# Patient Record
Sex: Male | Born: 1955 | Race: White | Hispanic: No | Marital: Married
Health system: Southern US, Community
[De-identification: ages and names within clinical notes are randomized; demographics above are authoritative.]

## PROBLEM LIST (undated history)

## (undated) DIAGNOSIS — K219 Gastro-esophageal reflux disease without esophagitis: Secondary | ICD-10-CM

## (undated) DIAGNOSIS — J349 Unspecified disorder of nose and nasal sinuses: Secondary | ICD-10-CM

## (undated) HISTORY — DX: Unspecified disorder of nose and nasal sinuses: J34.9

## (undated) HISTORY — DX: Gastro-esophageal reflux disease without esophagitis: K21.9

---

## 1997-12-09 HISTORY — PX: BACK SURGERY: SHX140

## 1998-11-14 ENCOUNTER — Encounter: Payer: Self-pay | Admitting: Neurosurgery

## 1998-11-16 ENCOUNTER — Encounter: Payer: Self-pay | Admitting: Neurosurgery

## 1998-11-16 ENCOUNTER — Inpatient Hospital Stay (HOSPITAL_COMMUNITY): Admission: RE | Admit: 1998-11-16 | Discharge: 1998-11-17 | Payer: Self-pay | Admitting: Neurosurgery

## 1999-12-10 HISTORY — PX: ELBOW SURGERY: SHX618

## 2000-04-28 ENCOUNTER — Encounter: Admission: RE | Admit: 2000-04-28 | Discharge: 2000-04-28 | Payer: Self-pay | Admitting: Family Medicine

## 2000-04-28 ENCOUNTER — Encounter: Payer: Self-pay | Admitting: Family Medicine

## 2001-04-02 ENCOUNTER — Encounter (INDEPENDENT_AMBULATORY_CARE_PROVIDER_SITE_OTHER): Payer: Self-pay | Admitting: Specialist

## 2001-04-02 ENCOUNTER — Ambulatory Visit (HOSPITAL_COMMUNITY): Admission: RE | Admit: 2001-04-02 | Discharge: 2001-04-02 | Payer: Self-pay | Admitting: *Deleted

## 2001-08-03 ENCOUNTER — Emergency Department (HOSPITAL_COMMUNITY): Admission: EM | Admit: 2001-08-03 | Discharge: 2001-08-03 | Payer: Self-pay | Admitting: Emergency Medicine

## 2001-08-05 ENCOUNTER — Ambulatory Visit (HOSPITAL_BASED_OUTPATIENT_CLINIC_OR_DEPARTMENT_OTHER): Admission: RE | Admit: 2001-08-05 | Discharge: 2001-08-05 | Payer: Self-pay | Admitting: Surgery

## 2001-08-05 ENCOUNTER — Encounter (INDEPENDENT_AMBULATORY_CARE_PROVIDER_SITE_OTHER): Payer: Self-pay | Admitting: *Deleted

## 2001-08-11 ENCOUNTER — Encounter (HOSPITAL_COMMUNITY): Admission: RE | Admit: 2001-08-11 | Discharge: 2001-11-09 | Payer: Self-pay | Admitting: Emergency Medicine

## 2001-08-15 ENCOUNTER — Emergency Department (HOSPITAL_COMMUNITY): Admission: EM | Admit: 2001-08-15 | Discharge: 2001-08-15 | Payer: Self-pay | Admitting: Emergency Medicine

## 2003-01-27 ENCOUNTER — Encounter: Payer: Self-pay | Admitting: Family Medicine

## 2003-01-27 ENCOUNTER — Encounter: Admission: RE | Admit: 2003-01-27 | Discharge: 2003-01-27 | Payer: Self-pay | Admitting: Family Medicine

## 2004-12-14 ENCOUNTER — Ambulatory Visit: Payer: Self-pay | Admitting: Family Medicine

## 2005-02-11 ENCOUNTER — Ambulatory Visit: Payer: Self-pay | Admitting: Family Medicine

## 2005-03-18 ENCOUNTER — Ambulatory Visit: Payer: Self-pay | Admitting: Family Medicine

## 2005-06-24 ENCOUNTER — Ambulatory Visit: Payer: Self-pay | Admitting: Family Medicine

## 2005-08-29 ENCOUNTER — Ambulatory Visit: Payer: Self-pay | Admitting: Family Medicine

## 2006-01-09 ENCOUNTER — Ambulatory Visit: Payer: Self-pay | Admitting: Family Medicine

## 2006-02-06 ENCOUNTER — Ambulatory Visit: Payer: Self-pay | Admitting: Family Medicine

## 2006-09-09 ENCOUNTER — Ambulatory Visit: Payer: Self-pay | Admitting: Family Medicine

## 2007-01-22 ENCOUNTER — Ambulatory Visit: Payer: Self-pay | Admitting: Family Medicine

## 2007-02-09 ENCOUNTER — Ambulatory Visit: Payer: Self-pay | Admitting: Internal Medicine

## 2007-02-11 ENCOUNTER — Ambulatory Visit: Payer: Self-pay | Admitting: Internal Medicine

## 2007-02-25 ENCOUNTER — Encounter (INDEPENDENT_AMBULATORY_CARE_PROVIDER_SITE_OTHER): Payer: Self-pay | Admitting: *Deleted

## 2007-02-25 ENCOUNTER — Ambulatory Visit: Payer: Self-pay | Admitting: Internal Medicine

## 2007-03-02 ENCOUNTER — Ambulatory Visit (HOSPITAL_COMMUNITY): Admission: RE | Admit: 2007-03-02 | Discharge: 2007-03-02 | Payer: Self-pay | Admitting: Internal Medicine

## 2007-06-02 ENCOUNTER — Ambulatory Visit: Payer: Self-pay | Admitting: Family Medicine

## 2011-04-26 NOTE — Assessment & Plan Note (Signed)
[facility]                         GASTROENTEROLOGY OFFICE NOTE   NAME:Adam Bautista, Adam Bautista                     MRN:          [id number]  DATE:02/09/2007                            DOB:          10-26-1956    CHIEF COMPLAINT:  Left upper quadrant pain, history of colon polyps.   ASSESSMENT:  1. History of adenomatous colon polyp April 2002 (Dr. Luther Parody).  2. Family history of colon cancer in a maternal aunt and a paternal      grandmother. His father has had colon polyps.  3. Intermittent left upper quadrant pain for one year, unclear      etiology.   PLAN:  1. Schedule colonoscopy for polyp surveillance. His left upper      quadrant pain could be a splenic flexure syndrome or a problem      there.  2. If that is unrevealing, an upper GI endoscopy could be necessary      versus a barium study or both. Previous upper GI in May of 2001,      demonstrated a small sliding type hiatal hernia and a mild smooth      narrowing below that. The barium tablet was delayed there, but did      pass. He has probably had an upper endoscopy in the past, but      cannot remember when and I do not have those records. He may have      some dysphagia, it is not entirely clear to be honest. He did have      a poor primary stripping wave on his upper GI as well so motility      disturbance is possible.   HISTORY:  A 55 year old white man with problems as described above. He  has had these intermittent crampy left upper quadrant pains that come  and go. It feels like he needs to belch and then will have to almost  induce belching by placing his finger in his mouth when it is severe. He  does not really have flatus problems. On his colonoscopy in 2002, he  apparently had some diarrhea issues and random biopsies were negative  for any colitis. He tells me he has lactose intolerance and irritable  bowel. Heartburn is well-controlled on Prilosec. Sometimes he has to  swallow more than once to move food down, but it does not sound like  there is any true impact dysphagia. Occasionally he gets a lump in his  throat or globus sensation. These are chronic problems. He is not  describing weight loss.   MEDICATIONS:  1. Adderall 15/30 mg daily.  2. Prilosec 20 mg daily.  3. Ambien 10 mg at bedtime.   DRUG ALLERGIES:  SULFA.   PAST MEDICAL HISTORY:  1. Attention deficit.  2. Back surgery.  3. Elbow surgery.  4. Allergies.  5. Gastroesophageal reflux disease.  6. Irritable bowel syndrome.  7. Colon polyps.   FAMILY HISTORY:  Also, positive for heart disease in his parents and  breast cancer in a mother and a grandmother. Diabetes in a grandfather.   SOCIAL HISTORY:  He is married.  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 No alcohol, tobacco or drugs.   REVIEW OF SYSTEMS:  He thinks he may have some palpitations at times. He  wears eyeglasses. He has some insomnia issues. Allergy problems. All  other systems are negative or as mentioned above.   PHYSICAL EXAMINATION:  Reveals a well-developed, well-nourished muscular  white man. Height is 6 feet. Weight is 194 pounds. Blood pressure  128/90, pulse 84.  EYES: Anicteric.  ENT: Normal mouth, lids and pharynx.  NECK: Is supple without mass, thyromegaly.  CHEST: Is clear.  HEART: S1, S2. No murmurs, gallops.  ABDOMEN: Soft. Is nontender without organomegaly or mass. Chest wall and  the left upper side is negative for pain or tenderness.  RECTAL: Examination is deferred.  LYMPHATIC: No neck or supraclavicular nodes palpated.  EXTREMITIES: Show no peripheral edema.  SKIN: Warm and dry. No rash.  NEURO: He alert and oriented x3.   I appreciate the opportunity to care for this patient. I have reviewed  the upper GI series from 2001 and the colonoscopy report and biopsy  reports from 2002.     Iva Boop, MD,FACG  Electronically Signed    CEG/MedQ  DD: 02/09/2007  DT:  02/09/2007  Job #: [id number]   cc:   Delaney Meigs, M.D.

## 2011-12-12 ENCOUNTER — Other Ambulatory Visit: Payer: Self-pay | Admitting: Gastroenterology

## 2011-12-31 ENCOUNTER — Other Ambulatory Visit: Payer: Self-pay | Admitting: Dermatology

## 2012-02-04 ENCOUNTER — Encounter: Payer: Self-pay | Admitting: Internal Medicine

## 2012-02-17 ENCOUNTER — Other Ambulatory Visit: Payer: Self-pay | Admitting: Dermatology

## 2012-04-20 ENCOUNTER — Other Ambulatory Visit: Payer: Self-pay | Admitting: Dermatology

## 2012-10-02 ENCOUNTER — Encounter: Payer: Self-pay | Admitting: Internal Medicine

## 2013-11-12 ENCOUNTER — Other Ambulatory Visit: Payer: Self-pay | Admitting: Rheumatology

## 2013-11-12 ENCOUNTER — Ambulatory Visit
Admission: RE | Admit: 2013-11-12 | Discharge: 2013-11-12 | Disposition: A | Discharge status: Home / Self Care | Payer: BC Managed Care – PPO | Source: Ambulatory Visit | Attending: Rheumatology | Admitting: Rheumatology

## 2013-11-12 DIAGNOSIS — R05 Cough: Secondary | ICD-10-CM

## 2013-11-16 ENCOUNTER — Other Ambulatory Visit (HOSPITAL_COMMUNITY): Payer: Self-pay | Admitting: Rheumatology

## 2013-11-16 DIAGNOSIS — R9389 Abnormal findings on diagnostic imaging of other specified body structures: Secondary | ICD-10-CM

## 2013-11-17 ENCOUNTER — Encounter (HOSPITAL_COMMUNITY): Payer: Self-pay

## 2013-11-17 ENCOUNTER — Ambulatory Visit (HOSPITAL_COMMUNITY)
Admission: RE | Admit: 2013-11-17 | Discharge: 2013-11-17 | Disposition: A | Discharge status: Home / Self Care | Payer: BC Managed Care – PPO | Source: Ambulatory Visit | Attending: Rheumatology | Admitting: Rheumatology

## 2013-11-17 DIAGNOSIS — R9389 Abnormal findings on diagnostic imaging of other specified body structures: Secondary | ICD-10-CM

## 2013-11-17 DIAGNOSIS — R918 Other nonspecific abnormal finding of lung field: Secondary | ICD-10-CM | POA: Insufficient documentation

## 2013-11-18 ENCOUNTER — Ambulatory Visit (HOSPITAL_COMMUNITY): Payer: BC Managed Care – PPO

## 2013-12-01 ENCOUNTER — Encounter: Payer: Self-pay | Admitting: Internal Medicine

## 2013-12-01 ENCOUNTER — Encounter (INDEPENDENT_AMBULATORY_CARE_PROVIDER_SITE_OTHER): Payer: Self-pay

## 2013-12-01 ENCOUNTER — Ambulatory Visit (INDEPENDENT_AMBULATORY_CARE_PROVIDER_SITE_OTHER): Payer: BC Managed Care – PPO | Admitting: Internal Medicine

## 2013-12-01 VITALS — BP 140/80 | HR 76 | Ht 72.0 in | Wt <= 1120 oz

## 2013-12-01 DIAGNOSIS — R911 Solitary pulmonary nodule: Secondary | ICD-10-CM

## 2013-12-01 NOTE — Progress Notes (Signed)
Subjective:    Patient ID: Adam Bautista, male    DOB: [birth year], 57 y.o.   MRN: [id number] PCP Josue Hector, MD Referred by: Dr Pollyann Savoy HPI  IOV 12/01/2013  Chief Complaint  Patient presents with  . Advice Only    Referred by Dr Abelina Bachelor for abn ct.      Passive smoker. Runner. SEvera years has had feet arthralgia. 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 CXR apparently showed right sided findings. So, resulted in CT that showed scattered pulm nodules lartgest is LLL 6mm. Denies resp symptoms   REiew of outsider ecords suggest positive anti cardiolipin Ab and ACE but negative quantiferon gold   CLINICAL DATA: Abnormal chest x-ray. Evaluate right lung opacity.  EXAM:  CT 11/17/13 CT CHEST WITHOUT CONTRAST  TECHNIQUE:  Multidetector CT imaging of the chest was performed following the  standard protocol without IV contrast.  COMPARISON: Radiographs 11/12/2013.  FINDINGS:  As evaluated in the noncontrast state, the mediastinum appears  normal. There are no enlarged mediastinal, hilar or axillary lymph  nodes. The heart and aorta appear normal. There are faint coronary  artery calcifications. There is a small hiatal hernia.  There is no pleural or pericardial effusion. There is no airspace  disease or volume loss in the right middle lobe or lingula. There is  no other airspace disease or endobronchial lesion. Scattered small  pulmonary nodules are noted, primarily on the left. The largest  nodules are in the left lower lobe, measuring 6 mm on image 31 and 5  mm on image 40.  The visualized upper abdomen is unremarkable. There is no adrenal  mass or worrisome osseous finding.  IMPRESSION:  1. No acute findings or definite correlate to the questioned  findings on prior radiographs. The right middle lobe and lingula  appear normal.  2. There are small pulmonary nodules bilaterally, largest on the  left.  If the patient is at high risk for bronchogenic carcinoma,  follow-up chest CT at 6-12 months is recommended. If the patient is  at low risk for bronchogenic carcinoma, follow-up chest CT at 12  months is recommended. This recommendation follows the consensus  statement: Guidelines for Management of Small Pulmonary Nodules  Detected on CT Scans: A Statement from the Fleischner Society as  published in Radiology 2005;237:395-400.  3. Faint coronary artery calcifications.  Electronically Signed  By: Roxy Horseman M.D.  On: 11/17/2013 12:26     Past Medical History  Diagnosis Date  . Sinus trouble      Family History  Problem Relation Age of Onset  . Allergies Mother   . Allergies Father   . Heart disease Father      History   Social History  . Marital Status: Married    Spouse Name: N/A    Number of Children: N/A  . Years of Education: N/A   Occupational History  . Not on file.   Social History Main Topics  . Smoking status: Passive Smoke Exposure - Never Smoker  . Smokeless tobacco: Never Used     Comment: Lived with grandfather for 8 years, smoked 2 ppd  . Alcohol Use: Yes     Comment: 1 glass of wine/day  . Drug Use: No  . Sexual Activity: Not on file   Other Topics Concern  . Not on file   Social History Narrative  . No narrative on file     Allergies  Allergen Reactions  .  Antihistamines, Chlorpheniramine-Type   . Dramamine [Dimenhydrinate]   . Peanuts [Peanut Oil]     Causes mouth sores per pt  . Sulfa Antibiotics     hives     No outpatient prescriptions prior to visit.   No facility-administered medications prior to visit.      Review of Systems  Constitutional: Negative for fever and unexpected weight change.  HENT: Positive for sinus pressure. Negative for congestion, dental problem, ear pain, nosebleeds, postnasal drip, rhinorrhea, sneezing, sore throat and trouble swallowing.   Eyes: Negative for redness and itching.  Respiratory:  Negative for cough, chest tightness, shortness of breath and wheezing.   Cardiovascular: Negative for palpitations and leg swelling.  Gastrointestinal: Negative for nausea and vomiting.  Genitourinary: Negative for dysuria.  Musculoskeletal: Positive for arthralgias and joint swelling.  Skin: Negative for rash.  Neurological: Negative for headaches.  Hematological: Does not bruise/bleed easily.  Psychiatric/Behavioral: Negative for dysphoric mood. The patient is not nervous/anxious.        Objective:   Physical Exam  Nursing note and vitals reviewed. Constitutional: He is oriented to person, place, and time. He appears well-developed and well-nourished. No distress.  HENT:  Head: Normocephalic and atraumatic.  Right Ear: External ear normal.  Left Ear: External ear normal.  Mouth/Throat: Oropharynx is clear and moist. No oropharyngeal exudate.  Eyes: Conjunctivae and EOM are normal. Pupils are equal, round, and reactive to light. Right eye exhibits no discharge. Left eye exhibits no discharge. No scleral icterus.  Neck: Normal range of motion. Neck supple. No JVD present. No tracheal deviation present. No thyromegaly present.  Cardiovascular: Normal rate, regular rhythm and intact distal pulses.  Exam reveals no gallop and no friction rub.   No murmur heard. Pulmonary/Chest: Effort normal and breath sounds normal. No respiratory distress. He has no wheezes. He has no rales. He exhibits no tenderness.  Abdominal: Soft. Bowel sounds are normal. He exhibits no distension and no mass. There is no tenderness. There is no rebound and no guarding.  Musculoskeletal: Normal range of motion. He exhibits no edema and no tenderness.  Lymphadenopathy:    He has no cervical adenopathy.  Neurological: He is alert and oriented to person, place, and time. He has normal reflexes. No cranial nerve deficit. Coordination normal.  Skin: Skin is warm and dry. No rash noted. He is not diaphoretic. No  erythema. No pallor.  Psychiatric: He has a normal mood and affect. His behavior is normal. Judgment and thought content normal.          Assessment & Plan:

## 2013-12-01 NOTE — Patient Instructions (Signed)
#  Left lower lobe nodule - 6mm  - highly unlikely this is lung cancer but we do not know what it is  - you need serial followup with CT for total 2 years minimum - return in 6 months with CT scan chest wo contrast  #followup  - after CT chest in 6 months

## 2013-12-01 NOTE — Assessment & Plan Note (Signed)
#  Left lower lobe nodule - 6mm  - highly unlikely this is lung cancer but we do not know what it is  - you need serial followup with CT for total 2 years minimum - return in 6 months with CT scan chest wo contrast  #followup  - after CT chest in 6 months 

## 2013-12-08 ENCOUNTER — Telehealth: Payer: Self-pay | Admitting: Internal Medicine

## 2013-12-08 NOTE — Telephone Encounter (Addendum)
Pt wanting to know more information about the spot on his lung. Pt wanting to know that if this is a cancer if there is a possibility of this being a secondary location/infection. I explained to the patient that this is something that he needs to speak with Dr Marchelle Gearing about so they can discuss his CT results in depth. Pt states that he just wants to be thorough and make sure that nothing is missed in the worst case scenario. Aware that MR out of office until Jan 5. 2015.  Will fwd to MR to address when he returns.  ---------------------------------------- (message closed in error)

## 2013-12-13 NOTE — Telephone Encounter (Signed)
lmomtcb x1 for pt 

## 2013-12-13 NOTE — Telephone Encounter (Signed)
That is possible too theoretically. However, in thast case they are larger and not just restricted to left lower lobe; thjey will be present bilaterally THat is why we follow these closely. AT this time given how small they are no indication to be scanning rest of body  Please tell him to communicate via MY CHART so I can answer further questions directly  Dr. Kalman ShanMurali Josealberto Montalto, M.D., [facility].C.C.P Pulmonary and Critical Care Medicine Staff Physician [facility] [place] Pulmonary and Critical Care Pager: [phone], If no answer or between  15:00h - 7:00h: call [phone]  12/13/2013 2:04 PM

## 2013-12-13 NOTE — Telephone Encounter (Signed)
I went over this on office vsiti 12/01/13. Highly unlikely lung cancer but not 100% ruled out. That is why we do serial followup for total 2 years. Other possibilites are soil fungal granuloma, old scar from infection and unknown. Too small to biopsy or figure out what it sbecause we do not have technologies for it. So best to watch. FU plan per priopr OV that involves  Fu CT chest   Dr. Kalman ShanMurali Staton Markey, M.D., [facility].C.P Pulmonary and Critical Care Medicine Staff Physician [facility] Pulmonary and Critical Care Pager: [phone], If no answer or between  15:00h - 7:00h: call [phone]  12/13/2013 1:54 PM

## 2013-12-13 NOTE — Telephone Encounter (Signed)
Spoke with pt. Made him aware of MR response. He reports his question was could this be coming from a secondary place (i.e somewhere else in his body) and this just now showing up in his lungs? Please advise thanks

## 2013-12-27 ENCOUNTER — Telehealth: Payer: Self-pay | Admitting: Internal Medicine

## 2013-12-27 NOTE — Telephone Encounter (Signed)
Patient returning call.

## 2013-12-27 NOTE — Telephone Encounter (Signed)
Kalman ShanMurali Ramaswamy, MD at 12/13/2013 2:03 PM     Status: Signed        That is possible too theoretically. However, in thast case they are larger and not just restricted to left lower lobe; thjey will be present bilaterally THat is why we follow these closely. AT this time given how small they are no indication to be scanning rest of body  Please tell him to communicate via MY CHART so I can answer further questions directly  Dr. Kalman ShanMurali Ramaswamy, M.D., [facility].C.P  Pulmonary and Critical Care Medicine  Staff Physician  [facility]  [place] Pulmonary and Critical Care  Pager: [phone], If no answer or between 15:00h - 7:00h: call [phone]  12/13/2013  2:04 PM   LMTCB

## 2013-12-27 NOTE — Telephone Encounter (Signed)
Back on 12-13-13 the pt wanted to know could the area in his lungs be coming from an issue somewhere else in his body?  MR response is below. Pt is aware. Carron CurieJennifer Castillo, CMA

## 2014-02-11 ENCOUNTER — Telehealth: Payer: Self-pay | Admitting: Internal Medicine

## 2014-02-11 NOTE — Telephone Encounter (Signed)
Spoke to patient. He got upst that he has to pay out of pocket and leveol visit shows "60 minutes" level 5. I explained that docs also charge based on formal complexity criteria establised. It was on that basis I had charged level 5. Explained that I will work with billing to get a discount for him given his out of pocket needs. He felt 60min category was misleading He was appreciative  Dr. Kalman ShanMurali Erickson Yamashiro, M.D., [facility].C.P Pulmonary and Critical Care Medicine Staff Physician [facility] [place] Pulmonary and Critical Care Pager: [phone], If no answer or between  15:00h - 7:00h: call [phone]  02/11/2014 4:08 PM

## 2014-05-16 ENCOUNTER — Ambulatory Visit (INDEPENDENT_AMBULATORY_CARE_PROVIDER_SITE_OTHER)
Admission: RE | Admit: 2014-05-16 | Discharge: 2014-05-16 | Disposition: A | Discharge status: Home / Self Care | Payer: BC Managed Care – PPO | Source: Ambulatory Visit | Attending: Internal Medicine | Admitting: Internal Medicine

## 2014-05-16 DIAGNOSIS — R911 Solitary pulmonary nodule: Secondary | ICD-10-CM

## 2014-05-17 ENCOUNTER — Inpatient Hospital Stay: Admission: RE | Admit: 2014-05-17 | Payer: BC Managed Care – PPO | Source: Ambulatory Visit

## 2014-05-18 ENCOUNTER — Telehealth: Payer: Self-pay | Admitting: Internal Medicine

## 2014-05-18 ENCOUNTER — Other Ambulatory Visit (HOSPITAL_COMMUNITY): Payer: Self-pay | Admitting: Rheumatology

## 2014-05-18 DIAGNOSIS — R911 Solitary pulmonary nodule: Secondary | ICD-10-CM

## 2014-05-18 NOTE — Telephone Encounter (Signed)
Pt will be at [phone] until 5:30pm- needs results today; if after 5:30 then contact patient on  [phone].    MR please advise asap. Thanks.

## 2014-05-18 NOTE — Telephone Encounter (Signed)
I spoke with pt aware we will send message to MR regarding results. Please advise thanks

## 2014-05-18 NOTE — Telephone Encounter (Signed)
  There is no change in nodules in 6 months. Given stabilityh and largest is 5-80mm; next CT chest wo contrast in 12 months from 05/18/2014. He need not come into discuss these results this time but does need to come in 12 months  From now at time of fu CT in 12 months  Dr. Kalman Shan, M.D., [facility].C.P Pulmonary and Critical Care Medicine Staff Physician [facility] [place] Pulmonary and Critical Care Pager: [phone], If no answer or between  15:00h - 7:00h: call [phone]  05/18/2014 4:25 PM     Ct Chest Wo Contrast  05/17/2014   CLINICAL DATA:  Evaluate pulmonary nodule  EXAM: CT CHEST WITHOUT CONTRAST  TECHNIQUE: Multidetector CT imaging of the chest was performed following the standard protocol without IV contrast.  COMPARISON:  11/17/2013  FINDINGS: No pleural effusion identified. 4 mm pulmonary nodule within the posterior right lung base is identified, image 56/series 3. This is unchanged from previous exam. Within the superior segment of the left lower lobe there is a 5 mm nodule, image 21/series 3. This is unchanged from previous exam. Left lower lobe index nodule is stable measuring 5 mm, image 30/series 3. No new or enlarging pulmonary nodules.  The heart size appears normal. No pericardial effusion. No mediastinal or hilar adenopathy. There is no axillary or supraclavicular adenopathy.  Incidental imaging through the upper abdomen is remarkable for a small 4 mm low attenuation structure which is too small to characterize. The adrenal glands both appear normal.  Review of the visualized bony structures is negative for aggressive lytic or sclerotic bone lesion.  IMPRESSION: 1. No acute findings. 2. Stable small pulmonary nodules compared with 11/17/2013. The largest of these nodules measures 5 mm. Regardless of the patient's risk factors, the next follow-up chest CT should be obtained at 12 months. This recommendation follows the consensus statement: Guidelines for Management  of Small Pulmonary Nodules Detected on CT Scans: A Statement from the Fleischner Society as published in Radiology 2005;237:395-400.   Electronically Signed   By: Signa Kell M.D.   On: 05/17/2014 08:49

## 2014-05-18 NOTE — Telephone Encounter (Signed)
Called and spoke with pt and he is aware of MR results.  Pt transferred up front to schedule his appts with MR next year.

## 2014-05-19 ENCOUNTER — Ambulatory Visit (HOSPITAL_COMMUNITY): Payer: BC Managed Care – PPO

## 2015-04-05 ENCOUNTER — Other Ambulatory Visit (HOSPITAL_COMMUNITY): Payer: Self-pay | Admitting: Family Medicine

## 2015-04-05 DIAGNOSIS — R918 Other nonspecific abnormal finding of lung field: Secondary | ICD-10-CM

## 2015-04-10 ENCOUNTER — Ambulatory Visit (HOSPITAL_COMMUNITY)
Admission: RE | Admit: 2015-04-10 | Discharge: 2015-04-10 | Disposition: A | Discharge status: Home / Self Care | Payer: 59 | Source: Ambulatory Visit | Attending: Family Medicine | Admitting: Family Medicine

## 2015-04-10 DIAGNOSIS — R918 Other nonspecific abnormal finding of lung field: Secondary | ICD-10-CM

## 2015-04-21 ENCOUNTER — Other Ambulatory Visit (HOSPITAL_COMMUNITY): Payer: Self-pay | Admitting: Family Medicine

## 2015-04-21 DIAGNOSIS — R918 Other nonspecific abnormal finding of lung field: Secondary | ICD-10-CM

## 2015-05-19 ENCOUNTER — Encounter (HOSPITAL_COMMUNITY): Payer: Self-pay

## 2015-05-19 ENCOUNTER — Ambulatory Visit (HOSPITAL_COMMUNITY)
Admission: RE | Admit: 2015-05-19 | Discharge: 2015-05-19 | Disposition: A | Discharge status: Home / Self Care | Payer: 59 | Source: Ambulatory Visit | Attending: Family Medicine | Admitting: Family Medicine

## 2015-05-19 DIAGNOSIS — R918 Other nonspecific abnormal finding of lung field: Secondary | ICD-10-CM | POA: Diagnosis present

## 2015-06-19 ENCOUNTER — Ambulatory Visit: Payer: 59 | Admitting: Internal Medicine

## 2015-06-20 ENCOUNTER — Encounter: Payer: Self-pay | Admitting: Internal Medicine

## 2015-08-07 ENCOUNTER — Ambulatory Visit: Payer: 59 | Admitting: Internal Medicine

## 2015-11-14 ENCOUNTER — Ambulatory Visit (INDEPENDENT_AMBULATORY_CARE_PROVIDER_SITE_OTHER): Payer: Commercial Managed Care - HMO | Admitting: Urology

## 2015-11-14 DIAGNOSIS — N486 Induration penis plastica: Secondary | ICD-10-CM

## 2015-12-21 ENCOUNTER — Telehealth: Payer: Self-pay | Admitting: General Practice

## 2015-12-21 NOTE — Telephone Encounter (Signed)
Is requesting Dr. Jones to take on as patient  °

## 2015-12-25 NOTE — Telephone Encounter (Signed)
Tried to contact patient.  No vm.

## 2015-12-25 NOTE — Telephone Encounter (Signed)
yes

## 2016-01-18 ENCOUNTER — Ambulatory Visit (INDEPENDENT_AMBULATORY_CARE_PROVIDER_SITE_OTHER): Payer: Managed Care, Other (non HMO) | Admitting: Internal Medicine

## 2016-01-18 ENCOUNTER — Encounter: Payer: Self-pay | Admitting: Internal Medicine

## 2016-01-18 VITALS — BP 120/82 | HR 82 | Temp 98.0°F | Resp 16 | Wt 215.0 lb

## 2016-01-18 DIAGNOSIS — R2232 Localized swelling, mass and lump, left upper limb: Secondary | ICD-10-CM

## 2016-01-18 DIAGNOSIS — I7300 Raynaud's syndrome without gangrene: Secondary | ICD-10-CM | POA: Diagnosis not present

## 2016-01-18 DIAGNOSIS — R911 Solitary pulmonary nodule: Secondary | ICD-10-CM

## 2016-01-18 DIAGNOSIS — Z0000 Encounter for general adult medical examination without abnormal findings: Secondary | ICD-10-CM

## 2016-01-18 DIAGNOSIS — F900 Attention-deficit hyperactivity disorder, predominantly inattentive type: Secondary | ICD-10-CM | POA: Diagnosis not present

## 2016-01-18 DIAGNOSIS — R2230 Localized swelling, mass and lump, unspecified upper limb: Secondary | ICD-10-CM | POA: Insufficient documentation

## 2016-01-18 MED ORDER — AMPHETAMINE-DEXTROAMPHET ER 30 MG PO CP24: 30.0000 mg | ORAL_CAPSULE | Freq: Every day | ORAL | Status: DC

## 2016-01-18 MED ORDER — TADALAFIL 5 MG PO TABS: 5.0000 mg | ORAL_TABLET | Freq: Every day | ORAL | Status: AC

## 2016-01-18 MED ORDER — TADALAFIL 5 MG PO TABS: 5.0000 mg | ORAL_TABLET | Freq: Every day | ORAL | Status: DC

## 2016-01-18 NOTE — Progress Notes (Signed)
Subjective:  Patient ID: Adam Bautista, male    DOB: 17-Aug-1956  Age: 60 y.o. MRN: [id number]  CC: ADHD  NEW TO ME   HPI SOU NOHR presents for a refill on medications for ADHD. He has no medical records from his prior physician and states he is not willing to get those records sent to me. He has a long-standing history of ADHD and is doing well on Adderall. It is been several years since he has had a physical but he is not willing to do a physical today. He complains of a mass on his left hand over the last few months that he feels like is growing. It causes some discomfort. He also has Raynaud's disease in his hands and needs a refill on Cialis for that.  Outpatient Prescriptions Prior to Visit  Medication Sig Dispense Refill  . amphetamine-dextroamphetamine (ADDERALL) 30 MG tablet Take 30 mg by mouth daily.    Marland Kitchen co-enzyme Q-10 30 MG capsule Take 30 mg by mouth daily.    Boris Lown Oil (MAXIMUM RED KRILL) 300 MG CAPS Take 1 capsule by mouth daily.    . Omega-3 Fatty Acids (FISH OIL) 1000 MG CAPS Take 1 capsule by mouth daily.    . Probiotic Product (PROBIOTIC DAILY PO) Take 1 capsule by mouth daily. Pt takes after eating dairy or when he remembers    . vitamin B-12 (CYANOCOBALAMIN) 100 MCG tablet Take 100 mcg by mouth daily.    . tadalafil (CIALIS) 5 MG tablet Take 5 mg by mouth daily as needed for erectile dysfunction.    Marland Kitchen aspirin 81 MG tablet Take 81 mg by mouth daily. Reported on 01/18/2016    . hydroxychloroquine (PLAQUENIL) 200 MG tablet Take 200 mg by mouth 2 (two) times daily. Reported on 01/18/2016    . omeprazole (PRILOSEC) 20 MG capsule Take 20 mg by mouth daily. Reported on 01/18/2016     No facility-administered medications prior to visit.    ROS Review of Systems  Constitutional: Negative for fever, chills, diaphoresis, appetite change, fatigue and unexpected weight change.  HENT: Negative.   Eyes: Negative.   Respiratory: Negative.   Cardiovascular: Negative.   Negative for chest pain, palpitations and leg swelling.  Gastrointestinal: Negative.  Negative for nausea, vomiting, abdominal pain, diarrhea and constipation.  Endocrine: Negative.   Genitourinary: Negative.   Musculoskeletal: Negative.  Negative for myalgias, back pain, joint swelling and arthralgias.  Skin: Negative.  Negative for color change and rash.  Allergic/Immunologic: Negative.   Neurological: Negative.  Negative for dizziness.  Hematological: Negative.   Psychiatric/Behavioral: Positive for decreased concentration. Negative for suicidal ideas, confusion, sleep disturbance, dysphoric mood and agitation. The patient is not nervous/anxious.        He has a long history of being easily distracted, having a lack of focus, and having trouble completing task at work.    Objective:  BP 120/82 mmHg  Pulse 82  Temp(Src) 98 F (36.7 C) (Oral)  Resp 16  Wt 215 lb (97.523 kg)  SpO2 97%  BP Readings from Last 3 Encounters:  01/18/16 120/82  12/01/13 140/80    Wt Readings from Last 3 Encounters:  01/18/16 215 lb (97.523 kg)  12/01/13 29 lb (13.154 kg)    Physical Exam  Constitutional: He is oriented to person, place, and time. No distress.  HENT:  Mouth/Throat: Oropharynx is clear and moist. No oropharyngeal exudate.  Eyes: Conjunctivae are normal. Right eye exhibits no discharge. Left eye exhibits no  discharge. No scleral icterus.  Neck: Normal range of motion. Neck supple. No JVD present. No tracheal deviation present. No thyromegaly present.  Cardiovascular: Normal rate, regular rhythm, S1 normal, S2 normal, normal heart sounds and intact distal pulses.  Exam reveals no gallop and no friction rub.   No murmur heard. Pulses:      Carotid pulses are 2+ on the right side, and 2+ on the left side.      Radial pulses are 1+ on the right side, and 1+ on the left side.       Femoral pulses are 2+ on the right side, and 2+ on the left side.      Popliteal pulses are 2+ on the  right side.       Dorsalis pedis pulses are 2+ on the right side, and 2+ on the left side.       Posterior tibial pulses are 2+ on the right side, and 2+ on the left side.  Pulmonary/Chest: Effort normal and breath sounds normal. No stridor. No respiratory distress. He has no wheezes. He has no rales. He exhibits no tenderness.  Abdominal: Soft. Bowel sounds are normal. He exhibits no distension and no mass. There is no tenderness. There is no rebound and no guarding.  Musculoskeletal: Normal range of motion. He exhibits no edema or tenderness.       Hands: Lymphadenopathy:    He has no cervical adenopathy.  Neurological: He is oriented to person, place, and time.  Skin: Skin is warm and dry. No rash noted. He is not diaphoretic. No erythema. No pallor.  Psychiatric: He has a normal mood and affect. His behavior is normal. Judgment and thought content normal.  Vitals reviewed.   No results found for: WBC, HGB, HCT, PLT, GLUCOSE, CHOL, TRIG, HDL, LDLDIRECT, LDLCALC, ALT, AST, NA, K, CL, CREATININE, BUN, CO2, TSH, PSA, INR, GLUF, HGBA1C, MICROALBUR  Ct Chest Wo Contrast  05/19/2015  CLINICAL DATA:  Follow up pulmonary nodules. Nonsmoker without history of malignancy. Subsequent encounter. EXAM: CT CHEST WITHOUT CONTRAST TECHNIQUE: Multidetector CT imaging of the chest was performed following the standard protocol without IV contrast. COMPARISON:  Chest CTs dated 11/17/2013 and 05/16/2014. FINDINGS: Mediastinum/Nodes: There are no enlarged mediastinal, hilar or axillary lymph nodes. There is a stable small hiatal hernia. The heart size is normal. There is no pericardial effusion.Minimal coronary artery atherosclerosis noted. Lungs/Pleura: There is no pleural effusion. Several small pulmonary nodules are again demonstrated bilaterally, not significantly changed. In the right lower lobe, there is a 3 mm nodule on image number 55. In the left lower lobe, there is a 6 mm nodule on image 29 and 5 mm  nodules on images 38 and 47. No new or enlarging nodules demonstrated. There is no endobronchial lesion or confluent airspace opacity. Upper abdomen: Tiny low-density lesion in the right hepatic lobe on image 59 is stable. The visualized upper abdomen otherwise appears normal. Musculoskeletal/Chest wall: There is no chest wall mass or suspicious osseous finding. IMPRESSION: 1. Stable small pulmonary nodules bilaterally since baseline examination of 18 months ago. In a low risk patient, no further follow-up necessary. This recommendation follows the consensus statement: Guidelines for Management of Small Pulmonary Nodules Detected on CT Scans: A Statement from the Fleischner Society as published in Radiology 2005; 237:395-400. 2. No acute findings or adenopathy. Electronically Signed   By: Carey Bullocks M.D.   On: 05/19/2015 17:08    Assessment & Plan:   Sterling was seen today for adhd.  Diagnoses and all orders for this visit:  Raynaud's disease- he has obtained excellent symptom relief with the daily intake of Cialis, will continue -     Discontinue: tadalafil (CIALIS) 5 MG tablet; Take 1 tablet (5 mg total) by mouth daily. -     tadalafil (CIALIS) 5 MG tablet; Take 1 tablet (5 mg total) by mouth daily.  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 -     Ambulatory referral to Orthopedic Surgery  Attention deficit hyperactivity disorder (ADHD), predominantly inattentive type- will continue Adderall at the current dose. -     Discontinue: amphetamine-dextroamphetamine (ADDERALL XR) 30 MG 24 hr capsule; Take 1 capsule (30 mg total) by mouth daily. -     Discontinue: amphetamine-dextroamphetamine (ADDERALL XR) 30 MG 24 hr capsule; Take 1 capsule (30 mg total) by mouth daily. -     amphetamine-dextroamphetamine (ADDERALL XR) 30 MG 24 hr capsule; Take 1 capsule (30 mg  total) by mouth daily.  Routine general medical examination at a health care facility- he was not willing to undergo a complete physical today but he did request that labs be ordered and advanced. -     Lipid panel; Future -     Comprehensive metabolic panel; Future -     CBC with Differential/Platelet; Future -     TSH; Future -     Urinalysis, Routine w reflex microscopic (not at [facility]); Future -     PSA; Future -     Hepatitis C antibody; Future  I have discontinued Mr. Deboard omeprazole. I am also having him maintain his hydroxychloroquine, amphetamine-dextroamphetamine, MAXIMUM RED KRILL, co-enzyme Q-10, vitamin B-12, aspirin, Fish Oil, Probiotic Product (PROBIOTIC DAILY PO), amphetamine-dextroamphetamine, and tadalafil.  Meds ordered this encounter  Medications  . DISCONTD: amphetamine-dextroamphetamine (ADDERALL XR) 10 MG 24 hr capsule    Sig: Take 10 mg by mouth daily. Patient takes once daily in am along with adderall 30 mg tab in pm  . DISCONTD: tadalafil (CIALIS) 5 MG tablet    Sig: Take 1 tablet (5 mg total) by mouth daily.    Dispense:  30 tablet    Refill:  11  . DISCONTD: amphetamine-dextroamphetamine (ADDERALL XR) 30 MG 24 hr capsule    Sig: Take 1 capsule (30 mg total) by mouth daily.    Dispense:  30 capsule    Refill:  0  . DISCONTD: amphetamine-dextroamphetamine (ADDERALL XR) 30 MG 24 hr capsule    Sig: Take 1 capsule (30 mg total) by mouth daily.    Dispense:  30 capsule    Refill:  0  . amphetamine-dextroamphetamine (ADDERALL XR) 30 MG 24 hr capsule    Sig: Take 1 capsule (30 mg total) by mouth daily.    Dispense:  30 capsule    Refill:  0  . tadalafil (CIALIS) 5 MG tablet    Sig: Take 1 tablet (5 mg total) by mouth daily.    Dispense:  30 tablet    Refill:  11     Follow-up: Return in about 3 months (around 04/16/2016).  Sanda Linger, MD

## 2016-01-18 NOTE — Progress Notes (Signed)
Pre visit review using our clinic review tool, if applicable. No additional management support is needed unless otherwise documented below in the visit note. 

## 2016-01-18 NOTE — Patient Instructions (Signed)
Attention Deficit Hyperactivity Disorder  Attention deficit hyperactivity disorder (ADHD) is a problem with behavior issues based on the way the brain functions (neurobehavioral disorder). It is a common reason for behavior and academic problems in school.  SYMPTOMS   There are 3 types of ADHD. The 3 types and some of the symptoms include:  · Inattentive.    Gets bored or distracted easily.    Loses or forgets things. Forgets to hand in homework.    Has trouble organizing or completing tasks.    Difficulty staying on task.    An inability to organize daily tasks and school work.    Leaving projects, chores, or homework unfinished.    Trouble paying attention or responding to details. Careless mistakes.    Difficulty following directions. Often seems like is not listening.    Dislikes activities that require sustained attention (like chores or homework).  · Hyperactive-impulsive.    Feels like it is impossible to sit still or stay in a seat. Fidgeting with hands and feet.    Trouble waiting turn.    Talking too much or out of turn. Interruptive.    Speaks or acts impulsively.    Aggressive, disruptive behavior.    Constantly busy or on the go; noisy.    Often leaves seat when they are expected to remain seated.    Often runs or climbs where it is not appropriate, or feels very restless.  · Combined.    Has symptoms of both of the above.  Often children with ADHD feel discouraged about themselves and with school. They often perform well below their abilities in school.  As children get older, the excess motor activities can calm down, but the problems with paying attention and staying organized persist. Most children do not outgrow ADHD but with good treatment can learn to cope with the symptoms.  DIAGNOSIS   When ADHD is suspected, the diagnosis should be made by professionals trained in ADHD. This professional will collect information about the individual suspected of having ADHD. Information must be collected from  various settings where the person lives, works, or attends school.    Diagnosis will include:  · Confirming symptoms began in childhood.  · Ruling out other reasons for the child's behavior.  · The health care providers will check with the child's school and check their medical records.  · They will talk to teachers and parents.  · Behavior rating scales for the child will be filled out by those dealing with the child on a daily basis.  A diagnosis is made only after all information has been considered.  TREATMENT   Treatment usually includes behavioral treatment, tutoring or extra support in school, and stimulant medicines. Because of the way a person's brain works with ADHD, these medicines decrease impulsivity and hyperactivity and increase attention. This is different than how they would work in a person who does not have ADHD. Other medicines used include antidepressants and certain blood pressure medicines.  Most experts agree that treatment for ADHD should address all aspects of the person's functioning. Along with medicines, treatment should include structured classroom management at school. Parents should reward good behavior, provide constant discipline, and set limits. Tutoring should be available for the child as needed.  ADHD is a lifelong condition. If untreated, the disorder can have long-term serious effects into adolescence and adulthood.  HOME CARE INSTRUCTIONS   · Often with ADHD there is a lot of frustration among family members dealing with the condition. Blame   and anger are also feelings that are common. In many cases, because the problem affects the family as a whole, the entire family may need help. A therapist can help the family find better ways to handle the disruptive behaviors of the person with ADHD and promote change. If the person with ADHD is young, most of the therapist's work is with the parents. Parents will learn techniques for coping with and improving their child's behavior.  Sometimes only the child with the ADHD needs counseling. Your health care providers can help you make these decisions.  · Children with ADHD may need help learning how to organize. Some helpful tips include:  ¨ Keep routines the same every day from wake-up time to bedtime. Schedule all activities, including homework and playtime. Keep the schedule in a place where the person with ADHD will often see it. Mark schedule changes as far in advance as possible.  ¨ Schedule outdoor and indoor recreation.  ¨ Have a place for everything and keep everything in its place. This includes clothing, backpacks, and school supplies.  ¨ Encourage writing down assignments and bringing home needed books. Work with your child's teachers for assistance in organizing school work.  · Offer your child a well-balanced diet. Breakfast that includes a balance of whole grains, protein, and fruits or vegetables is especially important for school performance. Children should avoid drinks with caffeine including:  ¨ Soft drinks.  ¨ Coffee.  ¨ Tea.  ¨ However, some older children (adolescents) may find these drinks helpful in improving their attention. Because it can also be common for adolescents with ADHD to become addicted to caffeine, talk with your health care provider about what is a safe amount of caffeine intake for your child.  · Children with ADHD need consistent rules that they can understand and follow. If rules are followed, give small rewards. Children with ADHD often receive, and expect, criticism. Look for good behavior and praise it. Set realistic goals. Give clear instructions. Look for activities that can foster success and self-esteem. Make time for pleasant activities with your child. Give lots of affection.  · Parents are their children's greatest advocates. Learn as much as possible about ADHD. This helps you become a stronger and better advocate for your child. It also helps you educate your child's teachers and instructors  if they feel inadequate in these areas. Parent support groups are often helpful. A national group with local chapters is called Children and Adults with Attention Deficit Hyperactivity Disorder (CHADD).  SEEK MEDICAL CARE IF:  · Your child has repeated muscle twitches, cough, or speech outbursts.  · Your child has sleep problems.  · Your child has a marked loss of appetite.  · Your child develops depression.  · Your child has new or worsening behavioral problems.  · Your child develops dizziness.  · Your child has a racing heart.  · Your child has stomach pains.  · Your child develops headaches.  SEEK IMMEDIATE MEDICAL CARE IF:  · Your child has been diagnosed with depression or anxiety and the symptoms seem to be getting worse.  · Your child has been depressed and suddenly appears to have increased energy or motivation.  · You are worried that your child is having a bad reaction to a medication he or she is taking for ADHD.     This information is not intended to replace advice given to you by your health care provider. Make sure you discuss any questions you have with your   health care provider.     Document Released: 11/15/2002 Document Revised: 11/30/2013 Document Reviewed: 08/02/2013  [employer] Interactive Patient Education ©2016 [employer]

## 2016-01-19 ENCOUNTER — Encounter: Payer: Self-pay | Admitting: Internal Medicine

## 2016-02-05 ENCOUNTER — Other Ambulatory Visit (INDEPENDENT_AMBULATORY_CARE_PROVIDER_SITE_OTHER): Payer: Managed Care, Other (non HMO)

## 2016-02-05 DIAGNOSIS — Z0000 Encounter for general adult medical examination without abnormal findings: Secondary | ICD-10-CM | POA: Diagnosis not present

## 2016-02-05 LAB — URINALYSIS, ROUTINE W REFLEX MICROSCOPIC
BILIRUBIN URINE: NEGATIVE
HGB URINE DIPSTICK: NEGATIVE
KETONES UR: NEGATIVE
Leukocytes, UA: NEGATIVE
NITRITE: NEGATIVE
PH: 6 (ref 5.0–8.0)
RBC / HPF: NONE SEEN (ref 0–?)
Specific Gravity, Urine: 1.025 (ref 1.000–1.030)
Total Protein, Urine: NEGATIVE
Urine Glucose: NEGATIVE
Urobilinogen, UA: 0.2 (ref 0.0–1.0)

## 2016-02-05 LAB — COMPREHENSIVE METABOLIC PANEL
ALBUMIN: 4.2 g/dL (ref 3.5–5.2)
ALT: 24 U/L (ref 0–53)
AST: 24 U/L (ref 0–37)
Alkaline Phosphatase: 48 U/L (ref 39–117)
BUN: 16 mg/dL (ref 6–23)
CHLORIDE: 104 meq/L (ref 96–112)
CO2: 29 mEq/L (ref 19–32)
Calcium: 9.1 mg/dL (ref 8.4–10.5)
Creatinine, Ser: 1.18 mg/dL (ref 0.40–1.50)
GFR: 66.84 mL/min (ref >60.00)
Glucose, Bld: 112 mg/dL — ABNORMAL HIGH (ref 70–99)
POTASSIUM: 4.5 meq/L (ref 3.5–5.1)
SODIUM: 139 meq/L (ref 135–145)
TOTAL PROTEIN: 6.7 g/dL (ref 6.0–8.3)
Total Bilirubin: 0.8 mg/dL (ref 0.2–1.2)

## 2016-02-05 LAB — CBC WITH DIFFERENTIAL/PLATELET
BASOS PCT: 0.7 % (ref 0.0–3.0)
Basophils Absolute: 0 10*3/uL (ref 0.0–0.1)
EOS PCT: 1.5 % (ref 0.0–5.0)
Eosinophils Absolute: 0.1 10*3/uL (ref 0.0–0.7)
HEMATOCRIT: 47.2 % (ref 39.0–52.0)
HEMOGLOBIN: 16.4 g/dL (ref 13.0–17.0)
Lymphocytes Relative: 39.5 % (ref 12.0–46.0)
Lymphs Abs: 2.6 10*3/uL (ref 0.7–4.0)
MCHC: 34.7 g/dL (ref 30.0–36.0)
MCV: 89.3 fl (ref 78.0–100.0)
Monocytes Absolute: 0.5 10*3/uL (ref 0.1–1.0)
Monocytes Relative: 7.8 % (ref 3.0–12.0)
Neutro Abs: 3.4 10*3/uL (ref 1.4–7.7)
Neutrophils Relative %: 50.5 % (ref 43.0–77.0)
Platelets: 235 10*3/uL (ref 150.0–400.0)
RBC: 5.28 Mil/uL (ref 4.22–5.81)
RDW: 12.7 % (ref 11.5–15.5)
WBC: 6.6 10*3/uL (ref 4.0–10.5)

## 2016-02-05 LAB — LIPID PANEL
Cholesterol: 209 mg/dL — ABNORMAL HIGH (ref 0–200)
HDL: 51.1 mg/dL (ref >39.00)
LDL CALC: 138 mg/dL — AB (ref 0–99)
NONHDL: 158.18
Total CHOL/HDL Ratio: 4
Triglycerides: 99 mg/dL (ref 0.0–149.0)
VLDL: 19.8 mg/dL (ref 0.0–40.0)

## 2016-02-05 LAB — TSH: TSH: 2.87 u[IU]/mL (ref 0.35–4.50)

## 2016-02-05 LAB — HEPATITIS C ANTIBODY: HCV AB: NEGATIVE

## 2016-02-05 LAB — PSA: PSA: 0.99 ng/mL (ref 0.10–4.00)

## 2016-02-06 ENCOUNTER — Ambulatory Visit: Payer: Managed Care, Other (non HMO) | Admitting: Internal Medicine

## 2016-02-06 ENCOUNTER — Encounter: Payer: Self-pay | Admitting: Internal Medicine

## 2016-02-07 ENCOUNTER — Ambulatory Visit: Payer: Managed Care, Other (non HMO) | Admitting: Internal Medicine

## 2016-02-27 ENCOUNTER — Ambulatory Visit (INDEPENDENT_AMBULATORY_CARE_PROVIDER_SITE_OTHER): Payer: Managed Care, Other (non HMO) | Admitting: Internal Medicine

## 2016-02-27 ENCOUNTER — Other Ambulatory Visit (INDEPENDENT_AMBULATORY_CARE_PROVIDER_SITE_OTHER): Payer: Managed Care, Other (non HMO)

## 2016-02-27 ENCOUNTER — Encounter: Payer: Self-pay | Admitting: Internal Medicine

## 2016-02-27 VITALS — BP 124/82 | HR 68 | Temp 98.3°F | Resp 16 | Ht 72.0 in | Wt 209.0 lb

## 2016-02-27 DIAGNOSIS — R739 Hyperglycemia, unspecified: Secondary | ICD-10-CM

## 2016-02-27 DIAGNOSIS — Z23 Encounter for immunization: Secondary | ICD-10-CM

## 2016-02-27 DIAGNOSIS — R002 Palpitations: Secondary | ICD-10-CM | POA: Diagnosis not present

## 2016-02-27 DIAGNOSIS — F900 Attention-deficit hyperactivity disorder, predominantly inattentive type: Secondary | ICD-10-CM

## 2016-02-27 DIAGNOSIS — Z0000 Encounter for general adult medical examination without abnormal findings: Secondary | ICD-10-CM

## 2016-02-27 DIAGNOSIS — N486 Induration penis plastica: Secondary | ICD-10-CM | POA: Diagnosis not present

## 2016-02-27 DIAGNOSIS — Z0001 Encounter for general adult medical examination with abnormal findings: Secondary | ICD-10-CM

## 2016-02-27 DIAGNOSIS — R911 Solitary pulmonary nodule: Secondary | ICD-10-CM

## 2016-02-27 LAB — HEMOGLOBIN A1C: Hgb A1c MFr Bld: 5.6 % (ref 4.6–6.5)

## 2016-02-27 LAB — BASIC METABOLIC PANEL
BUN: 14 mg/dL (ref 6–23)
CALCIUM: 9.4 mg/dL (ref 8.4–10.5)
CHLORIDE: 103 meq/L (ref 96–112)
CO2: 30 meq/L (ref 19–32)
CREATININE: 1.24 mg/dL (ref 0.40–1.50)
GFR: 63.11 mL/min (ref >60.00)
GLUCOSE: 103 mg/dL — AB (ref 70–99)
Potassium: 4 mEq/L (ref 3.5–5.1)
SODIUM: 138 meq/L (ref 135–145)

## 2016-02-27 LAB — FECAL OCCULT BLOOD, GUAIAC: Fecal Occult Blood: NEGATIVE

## 2016-02-27 MED ORDER — AMPHETAMINE-DEXTROAMPHETAMINE 30 MG PO TABS: 30.0000 mg | ORAL_TABLET | Freq: Every day | ORAL | Status: DC

## 2016-02-27 MED ORDER — AMPHETAMINE-DEXTROAMPHET ER 30 MG PO CP24: 30.0000 mg | ORAL_CAPSULE | Freq: Every day | ORAL | Status: DC

## 2016-02-27 MED ORDER — ZOLPIDEM TARTRATE 10 MG PO TABS: 10.0000 mg | ORAL_TABLET | Freq: Every evening | ORAL | Status: AC | PRN

## 2016-02-27 NOTE — Progress Notes (Signed)
Subjective:  Patient ID: Adam Bautista, male    DOB: 1956/08/07  Age: 60 y.o. MRN: [id number]  CC: Palpitations and Annual Exam   HPI Adam Bautista presents for a CPX.  He complains of a curvature in his penis and wants to see a urologist. He complains of palpitations at night while he is lying in the bed. He describes it as intermittent episodes of feeling skipped heartbeats. He doesn't note any symptoms during the day and denies chest pain, shortness of breath, dizziness, near syncope, edema, or fatigue.  Outpatient Prescriptions Prior to Visit  Medication Sig Dispense Refill  . co-enzyme Q-10 30 MG capsule Take 30 mg by mouth daily.    Boris Lown Oil (MAXIMUM RED KRILL) 300 MG CAPS Take 1 capsule by mouth daily.    . Omega-3 Fatty Acids (FISH OIL) 1000 MG CAPS Take 1 capsule by mouth daily.    . Probiotic Product (PROBIOTIC DAILY PO) Take 1 capsule by mouth daily. Pt takes after eating dairy or when he remembers    . tadalafil (CIALIS) 5 MG tablet Take 1 tablet (5 mg total) by mouth daily. 30 tablet 11  . vitamin B-12 (CYANOCOBALAMIN) 100 MCG tablet Take 100 mcg by mouth daily.    Marland Kitchen amphetamine-dextroamphetamine (ADDERALL XR) 30 MG 24 hr capsule Take 1 capsule (30 mg total) by mouth daily. (Patient taking differently: Take 30 mg by mouth daily as needed. ) 30 capsule 0  . amphetamine-dextroamphetamine (ADDERALL) 30 MG tablet Take 30 mg by mouth daily.    Marland Kitchen aspirin 81 MG tablet Take 81 mg by mouth daily. Reported on 01/18/2016    . hydroxychloroquine (PLAQUENIL) 200 MG tablet Take 200 mg by mouth 2 (two) times daily. Reported on 01/18/2016     No facility-administered medications prior to visit.    ROS Review of Systems  Constitutional: Negative.  Negative for fever, chills, diaphoresis, appetite change and fatigue.  HENT: Negative.   Eyes: Negative.   Respiratory: Negative.  Negative for cough, choking, chest tightness, shortness of breath, wheezing and stridor.     Cardiovascular: Positive for palpitations. Negative for chest pain and leg swelling.  Gastrointestinal: Negative.  Negative for nausea, vomiting, abdominal pain, diarrhea, constipation and blood in stool.  Endocrine: Negative.   Genitourinary: Positive for penile pain. Negative for dysuria, urgency, frequency, hematuria, decreased urine volume, discharge, penile swelling, difficulty urinating and testicular pain.  Musculoskeletal: Negative.  Negative for myalgias, back pain, joint swelling, arthralgias and neck pain.  Skin: Negative.  Negative for color change and rash.  Allergic/Immunologic: Negative.   Neurological: Negative.  Negative for dizziness, weakness, light-headedness and numbness.  Hematological: Negative.  Negative for adenopathy. Does not bruise/bleed easily.  Psychiatric/Behavioral: Positive for sleep disturbance and decreased concentration. Negative for suicidal ideas, confusion, self-injury and dysphoric mood. The patient is not nervous/anxious.     Objective:  BP 124/82 mmHg  Pulse 68  Temp(Src) 98.3 F (36.8 C) (Oral)  Resp 16  Ht 6' (1.829 m)  Wt 209 lb (94.802 kg)  BMI 28.34 kg/m2  SpO2 97%  BP Readings from Last 3 Encounters:  02/27/16 124/82  01/18/16 120/82  12/01/13 140/80    Wt Readings from Last 3 Encounters:  02/27/16 209 lb (94.802 kg)  01/18/16 215 lb (97.523 kg)  12/01/13 29 lb (13.154 kg)    Physical Exam  Constitutional: He is oriented to person, place, and time. He appears well-developed and well-nourished. No distress.  HENT:  Head: Normocephalic and atraumatic.  Mouth/Throat: Oropharynx is clear and moist. No oropharyngeal exudate.  Eyes: Conjunctivae are normal. Right eye exhibits no discharge. Left eye exhibits no discharge. No scleral icterus.  Neck: Normal range of motion. Neck supple. No JVD present. No tracheal deviation present. No thyromegaly present.  Cardiovascular: Normal rate, regular rhythm, normal heart sounds and intact  distal pulses.  Exam reveals no gallop and no friction rub.   No murmur heard. Pulses:      Carotid pulses are 1+ on the right side, and 1+ on the left side.      Radial pulses are 1+ on the right side, and 1+ on the left side.       Femoral pulses are 1+ on the right side, and 1+ on the left side.      Popliteal pulses are 1+ on the right side, and 1+ on the left side.       Dorsalis pedis pulses are 1+ on the right side, and 1+ on the left side.       Posterior tibial pulses are 1+ on the right side, and 1+ on the left side.  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   ABNORMAL   Pulmonary/Chest: Effort normal and breath sounds normal. No stridor. No respiratory distress. He has no wheezes. He has no rales. He exhibits no tenderness.  Abdominal: Soft. He exhibits no distension and no mass. There is no tenderness. There is no rebound and no guarding. Hernia confirmed negative in the right inguinal area and confirmed negative in the left inguinal area.  Genitourinary: Rectum normal, prostate normal, testes normal and penis normal. Rectal exam shows no external hemorrhoid, no internal hemorrhoid, no fissure, no mass, no tenderness and anal tone normal. Guaiac negative stool. Prostate is not enlarged and not tender. Right testis shows no mass, no swelling and no tenderness. Right testis is descended. Left testis shows no mass, no swelling and no tenderness. Left testis is descended. Circumcised. No penile erythema or penile tenderness. No discharge found.  Musculoskeletal: Normal range of motion. He exhibits no edema or tenderness.  Lymphadenopathy:    He has no cervical adenopathy.       Right: No inguinal adenopathy present.       Left: No inguinal adenopathy present.  Neurological: He is oriented to person, place, and time.  Skin: Skin is warm and dry. No rash noted. He is not diaphoretic. No erythema. No pallor.  Psychiatric: He has a normal mood and  affect. His behavior is normal. Judgment and thought content normal.  Vitals reviewed.   Lab Results  Component Value Date   WBC 6.6 02/05/2016   HGB 16.4 02/05/2016   HCT 47.2 02/05/2016   PLT 235.0 02/05/2016   GLUCOSE 103* 02/27/2016   CHOL 209* 02/05/2016   TRIG 99.0 02/05/2016   HDL 51.10 02/05/2016   LDLCALC 138* 02/05/2016   ALT 24 02/05/2016   AST 24 02/05/2016   NA 138 02/27/2016   K 4.0 02/27/2016   CL 103 02/27/2016   CREATININE 1.24 02/27/2016   BUN 14 02/27/2016   CO2 30 02/27/2016   TSH 2.87 02/05/2016   PSA 0.99 02/05/2016   HGBA1C 5.6 02/27/2016    Ct Chest Wo Contrast  05/19/2015  CLINICAL DATA:  Follow up pulmonary nodules. Nonsmoker without history of malignancy. Subsequent encounter. EXAM: CT CHEST WITHOUT CONTRAST TECHNIQUE: Multidetector CT imaging of the chest was performed following the standard protocol without IV contrast. COMPARISON:  Chest CTs dated 11/17/2013 and 05/16/2014. FINDINGS: Mediastinum/Nodes: There are no enlarged mediastinal, hilar or axillary lymph nodes. There is a stable small hiatal hernia. The heart size is normal. There is no pericardial effusion.Minimal coronary artery atherosclerosis noted. Lungs/Pleura: There is no pleural effusion. Several small pulmonary nodules are again demonstrated bilaterally, not significantly changed. In the right lower lobe, there is a 3 mm nodule on image number 55. In the left lower lobe, there is a 6 mm nodule on image 29 and 5 mm nodules on images 38 and 47. No new or enlarging nodules demonstrated. There is no endobronchial lesion or confluent airspace opacity. Upper abdomen: Tiny low-density lesion in the right hepatic lobe on image 59 is stable. The visualized upper abdomen otherwise appears normal. Musculoskeletal/Chest wall: There is no chest wall mass or suspicious osseous finding. IMPRESSION: 1. Stable small pulmonary nodules bilaterally since baseline examination of 18 months ago. In a low risk  patient, no further follow-up necessary. This recommendation follows the consensus statement: Guidelines for Management of Small Pulmonary Nodules Detected on CT Scans: A Statement from the Fleischner Society as published in Radiology 2005; 237:395-400. 2. No acute findings or adenopathy. Electronically Signed   By: Carey Bullocks M.D.   On: 05/19/2015 17:08    Assessment & Plan:   Tiyon was seen today for palpitations and annual exam.  Diagnoses and all orders for this visit:  Routine general medical examination at a health care facility - his vaccines were reviewed, he was given a Zostavax today, exam completed, labs reviewed, his colonoscopy is up-to-date, I have no records from his prior physician, patient education material was given. -     Cancel: Lipid panel; Future -     Cancel: TSH; Future -     Cancel: Urinalysis, Routine w reflex microscopic (not at [facility]); Future -     Cancel: PSA; Future -     Cancel: Comprehensive metabolic panel; Future -     Cancel: CBC with Differential/Platelet; Future -     Cancel: Hepatitis C antibody; Future -     HIV antibody; Future -     EKG 12-Lead  Need for Tdap vaccination -     Tdap vaccine greater than or equal to 7yo IM  Hyperglycemia- he has mild prediabetes, no medications are needed at this time. -     Basic metabolic panel; Future -     Hemoglobin A1c; Future  Peyronie disease -     Ambulatory referral to Urology  Attention deficit hyperactivity disorder (ADHD), predominantly inattentive type -     amphetamine-dextroamphetamine (ADDERALL XR) 30 MG 24 hr capsule; Take 1 capsule (30 mg total) by mouth daily. -     amphetamine-dextroamphetamine (ADDERALL) 30 MG tablet; Take 1 tablet by mouth daily.  Lung nodule  Palpitations- he has benign sounding palpitations but his EKG is abnormal, I've asked him to have an event monitor to see if there is a significant dysrhythmia. -     Cardiac event monitor; Future  Need for varicella  vaccine -     Cancel: Varicella-zoster vaccine subcutaneous  Need for zoster vaccination -     Varicella-zoster vaccine subcutaneous  Other orders -     zolpidem (AMBIEN) 10 MG tablet; Take 1 tablet (10 mg total) by mouth at bedtime as needed for sleep.  I have discontinued Adam Bautista hydroxychloroquine and aspirin. I have also changed his amphetamine-dextroamphetamine and zolpidem. Additionally, I am having him maintain his MAXIMUM RED KRILL, co-enzyme Q-10,  vitamin B-12, Fish Oil, Probiotic Product (PROBIOTIC DAILY PO), tadalafil, and amphetamine-dextroamphetamine.  Meds ordered this encounter  Medications  . DISCONTD: zolpidem (AMBIEN) 10 MG tablet    Sig: Take 10 mg by mouth at bedtime as needed for sleep.  Marland Kitchen amphetamine-dextroamphetamine (ADDERALL XR) 30 MG 24 hr capsule    Sig: Take 1 capsule (30 mg total) by mouth daily.    Dispense:  30 capsule    Refill:  0  . amphetamine-dextroamphetamine (ADDERALL) 30 MG tablet    Sig: Take 1 tablet by mouth daily.    Dispense:  30 tablet    Refill:  0  . zolpidem (AMBIEN) 10 MG tablet    Sig: Take 1 tablet (10 mg total) by mouth at bedtime as needed for sleep.    Dispense:  30 tablet    Refill:  1     Follow-up: Return in about 1 year (around 02/26/2017).  Sanda Linger, MD

## 2016-02-27 NOTE — Progress Notes (Signed)
Pre visit review using our clinic review tool, if applicable. No additional management support is needed unless otherwise documented below in the visit note. 

## 2016-02-27 NOTE — Patient Instructions (Signed)

## 2016-02-28 LAB — HIV ANTIBODY (ROUTINE TESTING W REFLEX): HIV: NONREACTIVE

## 2016-03-04 ENCOUNTER — Ambulatory Visit: Payer: Managed Care, Other (non HMO) | Admitting: Family Medicine

## 2016-03-06 ENCOUNTER — Telehealth: Payer: Self-pay | Admitting: Internal Medicine

## 2016-03-06 NOTE — Telephone Encounter (Signed)
Pt informed

## 2016-03-06 NOTE — Telephone Encounter (Signed)
Pt called in and would like a nurse to call him about his results of he blood work  As soon as possible

## 2016-04-24 ENCOUNTER — Other Ambulatory Visit: Payer: Self-pay | Admitting: Internal Medicine

## 2016-04-24 DIAGNOSIS — F900 Attention-deficit hyperactivity disorder, predominantly inattentive type: Secondary | ICD-10-CM

## 2016-04-24 MED ORDER — AMPHETAMINE-DEXTROAMPHET ER 30 MG PO CP24: 30.0000 mg | ORAL_CAPSULE | Freq: Every day | ORAL | Status: DC

## 2016-04-24 MED ORDER — AMPHETAMINE-DEXTROAMPHETAMINE 30 MG PO TABS: 30.0000 mg | ORAL_TABLET | Freq: Every day | ORAL | Status: DC

## 2016-04-24 NOTE — Telephone Encounter (Signed)
Ok to fill 

## 2016-04-24 NOTE — Telephone Encounter (Signed)
Pt called in and needs refill on both of his Adderalls, needs to be sent to [facility].

## 2016-04-25 NOTE — Telephone Encounter (Signed)
Called pt no answer LMOm rx ready for pick-up.../lmb 

## 2016-04-30 ENCOUNTER — Ambulatory Visit (INDEPENDENT_AMBULATORY_CARE_PROVIDER_SITE_OTHER): Payer: Managed Care, Other (non HMO)

## 2016-04-30 DIAGNOSIS — R002 Palpitations: Secondary | ICD-10-CM | POA: Diagnosis not present

## 2016-05-08 ENCOUNTER — Telehealth: Payer: Self-pay | Admitting: Internal Medicine

## 2016-05-08 NOTE — Telephone Encounter (Signed)
Pt called in and would like to know if he has to keep wearing the heart monitor.  He is frustrated with it and is in his way.  What needs to be done with it?    Best number [phone]

## 2016-05-08 NOTE — Telephone Encounter (Signed)
Informed pt per PCP it is okay to take off monitor. Received fax from [facility]care today with reading from monitor and PCP went over reading and said it was sufficent to know it was enough to kno if any abnormalities were present. Pt states he will wear for next few days and then d/c

## 2016-05-21 NOTE — Telephone Encounter (Signed)
Called pt he is going to return monitior to heart care tomorrow cannnot find return packing slip. Made appt for Monday to f.u with PCP per pt instrutionc due to ins running out

## 2016-05-21 NOTE — Telephone Encounter (Signed)
Patient would like to discuss heart monitor. Please advise best [phone] (M)

## 2016-05-27 ENCOUNTER — Ambulatory Visit (INDEPENDENT_AMBULATORY_CARE_PROVIDER_SITE_OTHER): Payer: Managed Care, Other (non HMO) | Admitting: Internal Medicine

## 2016-05-27 ENCOUNTER — Encounter: Payer: Self-pay | Admitting: Internal Medicine

## 2016-05-27 VITALS — BP 104/80 | HR 81 | Temp 97.6°F | Resp 16 | Ht 72.0 in | Wt 204.0 lb

## 2016-05-27 DIAGNOSIS — F900 Attention-deficit hyperactivity disorder, predominantly inattentive type: Secondary | ICD-10-CM

## 2016-05-27 DIAGNOSIS — K2270 Barrett's esophagus without dysplasia: Secondary | ICD-10-CM | POA: Diagnosis not present

## 2016-05-27 DIAGNOSIS — R002 Palpitations: Secondary | ICD-10-CM

## 2016-05-27 MED ORDER — AMPHETAMINE-DEXTROAMPHET ER 30 MG PO CP24: 30.0000 mg | ORAL_CAPSULE | Freq: Every day | ORAL | Status: DC

## 2016-05-27 MED ORDER — AMPHETAMINE-DEXTROAMPHETAMINE 30 MG PO TABS: 30.0000 mg | ORAL_TABLET | Freq: Every day | ORAL | Status: DC

## 2016-05-27 NOTE — Progress Notes (Signed)
Subjective:  Patient ID: Adam Bautista, male    DOB: 1955/12/16  Age: 60 y.o. MRN: [id number]  CC: Palpitations   HPI Adam Bautista presents for follow-up on palpitations. He tells me that he completed his cardiac event monitor last week and we are waiting for the results to be interpreted by a cardiologist. He tells me he continues to run long distances and does not experience dyspnea on exertion or fatigue. He tells me the only time he notices the palpitations is when he is lying still or trying to sleep and he describes it as an occasional skipped beat that it is not associated with any symptoms such as diaphoresis, near syncope, edema, chest pain, or dizziness. He has multiple family members that have had heart rhythm complications requiring treatment of atrial fibrillation and implantation of pacemakers.  He also requests a referral to gastroenterologist. Tells me that he has a history of GERD and had an upper endoscopy done several years ago that was positive for Barrett's. He has adjusted his diet and has gotten his acid reflux disease under control with no medications. He denies any recent episodes of heartburn, odynophagia, dysphagia, loss of appetite, or weight loss.  Pt states ADD status overall stable on current meds with overall good compliance and tolerability, and good effectiveness with respect to ability for concentration and task completion.  Outpatient Prescriptions Prior to Visit  Medication Sig Dispense Refill  . co-enzyme Q-10 30 MG capsule Take 30 mg by mouth daily.    Boris Lown Oil (MAXIMUM RED KRILL) 300 MG CAPS Take 1 capsule by mouth daily.    . Omega-3 Fatty Acids (FISH OIL) 1000 MG CAPS Take 1 capsule by mouth daily.    . Probiotic Product (PROBIOTIC DAILY PO) Take 1 capsule by mouth daily. Pt takes after eating dairy or when he remembers    . tadalafil (CIALIS) 5 MG tablet Take 1 tablet (5 mg total) by mouth daily. 30 tablet 11  . vitamin B-12 (CYANOCOBALAMIN)  100 MCG tablet Take 100 mcg by mouth daily.    Marland Kitchen zolpidem (AMBIEN) 10 MG tablet Take 1 tablet (10 mg total) by mouth at bedtime as needed for sleep. 30 tablet 1  . amphetamine-dextroamphetamine (ADDERALL XR) 30 MG 24 hr capsule Take 1 capsule (30 mg total) by mouth daily. 30 capsule 0  . amphetamine-dextroamphetamine (ADDERALL) 30 MG tablet Take 1 tablet by mouth daily. 30 tablet 0   No facility-administered medications prior to visit.    ROS Review of Systems  Constitutional: Negative.  Negative for fever, chills, diaphoresis, appetite change and fatigue.  HENT: Negative.  Negative for sinus pressure, trouble swallowing and voice change.   Eyes: Negative.   Respiratory: Negative.  Negative for cough, choking, chest tightness, shortness of breath and stridor.   Cardiovascular: Positive for palpitations. Negative for chest pain and leg swelling.  Gastrointestinal: Negative.  Negative for nausea, vomiting, abdominal pain and diarrhea.  Endocrine: Negative.   Genitourinary: Negative.  Negative for dysuria, urgency, enuresis, difficulty urinating and testicular pain.  Musculoskeletal: Negative.   Skin: Negative.   Allergic/Immunologic: Negative.   Neurological: Negative.  Negative for dizziness, seizures, facial asymmetry, weakness, light-headedness, numbness and headaches.  Hematological: Negative.  Negative for adenopathy. Does not bruise/bleed easily.  Psychiatric/Behavioral: Positive for decreased concentration. Negative for hallucinations, behavioral problems, confusion, sleep disturbance, self-injury and dysphoric mood. The patient is not nervous/anxious and is not hyperactive.     Objective:  BP 104/80 mmHg  Pulse 81  Temp(Src) 97.6 F (36.4 C) (Oral)  Resp 16  Ht 6' (1.829 m)  Wt 204 lb (92.534 kg)  BMI 27.66 kg/m2  SpO2 98%  BP Readings from Last 3 Encounters:  05/27/16 104/80  02/27/16 124/82  01/18/16 120/82    Wt Readings from Last 3 Encounters:  05/27/16 204 lb  (92.534 kg)  02/27/16 209 lb (94.802 kg)  01/18/16 215 lb (97.523 kg)    Physical Exam  Constitutional: He is oriented to person, place, and time. No distress.  HENT:  Mouth/Throat: Oropharynx is clear and moist. No oropharyngeal exudate.  Eyes: Conjunctivae are normal. Right eye exhibits no discharge. Left eye exhibits no discharge. No scleral icterus.  Neck: Normal range of motion. Neck supple. No JVD present. No tracheal deviation present. No thyromegaly present.  Cardiovascular: Normal rate, regular rhythm, normal heart sounds and intact distal pulses.  Exam reveals no gallop and no friction rub.   No murmur heard. 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   ABNORMAL   Pulmonary/Chest: Effort normal and breath sounds normal. No stridor. No respiratory distress. He has no wheezes. He has no rales. He exhibits no tenderness.  Abdominal: Soft. Bowel sounds are normal. He exhibits no distension and no mass. There is no tenderness. There is no rebound and no guarding.  Musculoskeletal: Normal range of motion. He exhibits no edema or tenderness.  Lymphadenopathy:    He has no cervical adenopathy.  Neurological: He is oriented to person, place, and time.  Skin: Skin is warm and dry. No rash noted. He is not diaphoretic. No erythema. No pallor.  Vitals reviewed.   Lab Results  Component Value Date   WBC 6.6 02/05/2016   HGB 16.4 02/05/2016   HCT 47.2 02/05/2016   PLT 235.0 02/05/2016   GLUCOSE 103* 02/27/2016   CHOL 209* 02/05/2016   TRIG 99.0 02/05/2016   HDL 51.10 02/05/2016   LDLCALC 138* 02/05/2016   ALT 24 02/05/2016   AST 24 02/05/2016   NA 138 02/27/2016   K 4.0 02/27/2016   CL 103 02/27/2016   CREATININE 1.24 02/27/2016   BUN 14 02/27/2016   CO2 30 02/27/2016   TSH 2.87 02/05/2016   PSA 0.99 02/05/2016   HGBA1C 5.6 02/27/2016    Ct Chest Wo Contrast  05/19/2015  CLINICAL DATA:  Follow up pulmonary nodules. Nonsmoker  without history of malignancy. Subsequent encounter. EXAM: CT CHEST WITHOUT CONTRAST TECHNIQUE: Multidetector CT imaging of the chest was performed following the standard protocol without IV contrast. COMPARISON:  Chest CTs dated 11/17/2013 and 05/16/2014. FINDINGS: Mediastinum/Nodes: There are no enlarged mediastinal, hilar or axillary lymph nodes. There is a stable small hiatal hernia. The heart size is normal. There is no pericardial effusion.Minimal coronary artery atherosclerosis noted. Lungs/Pleura: There is no pleural effusion. Several small pulmonary nodules are again demonstrated bilaterally, not significantly changed. In the right lower lobe, there is a 3 mm nodule on image number 55. In the left lower lobe, there is a 6 mm nodule on image 29 and 5 mm nodules on images 38 and 47. No new or enlarging nodules demonstrated. There is no endobronchial lesion or confluent airspace opacity. Upper abdomen: Tiny low-density lesion in the right hepatic lobe on image 59 is stable. The visualized upper abdomen otherwise appears normal. Musculoskeletal/Chest wall: There is no chest wall mass or suspicious osseous finding. IMPRESSION: 1. Stable small pulmonary nodules bilaterally since baseline examination of 18 months ago. In a low  risk patient, no further follow-up necessary. This recommendation follows the consensus statement: Guidelines for Management of Small Pulmonary Nodules Detected on CT Scans: A Statement from the Fleischner Society as published in Radiology 2005; 237:395-400. 2. No acute findings or adenopathy. Electronically Signed   By: Carey BullocksWilliam  Veazey M.D.   On: 05/19/2015 17:08    Assessment & Plan:   Adam Bautista was seen today for palpitations.  Diagnoses and all orders for this visit:  Attention deficit hyperactivity disorder (ADHD), predominantly inattentive type -     amphetamine-dextroamphetamine (ADDERALL XR) 30 MG 24 hr capsule; Take 1 capsule (30 mg total) by mouth daily. -      amphetamine-dextroamphetamine (ADDERALL) 30 MG tablet; Take 1 tablet by mouth daily.  Palpitations- his palpitations sound benign, EKG today is unchanged, I await the results of his recent event monitor -     EKG 12-Lead  Barrett's esophagus without dysplasia -     Ambulatory referral to Gastroenterology   I am having Adam Bautista maintain his MAXIMUM RED KRILL, co-enzyme Q-10, vitamin B-12, Fish Oil, Probiotic Product (PROBIOTIC DAILY PO), tadalafil, zolpidem, amphetamine-dextroamphetamine, and amphetamine-dextroamphetamine.  Meds ordered this encounter  Medications  . amphetamine-dextroamphetamine (ADDERALL XR) 30 MG 24 hr capsule    Sig: Take 1 capsule (30 mg total) by mouth daily.    Dispense:  30 capsule    Refill:  0  . amphetamine-dextroamphetamine (ADDERALL) 30 MG tablet    Sig: Take 1 tablet by mouth daily.    Dispense:  30 tablet    Refill:  0     Follow-up: Return in about 4 months (around 09/26/2016).  Sanda Lingerhomas Jerah Esty, MD

## 2016-05-27 NOTE — Progress Notes (Signed)
Pre visit review using our clinic review tool, if applicable. No additional management support is needed unless otherwise documented below in the visit note. 

## 2016-05-27 NOTE — Patient Instructions (Signed)

## 2016-06-05 ENCOUNTER — Encounter: Payer: Self-pay | Admitting: Internal Medicine

## 2016-06-21 ENCOUNTER — Other Ambulatory Visit: Payer: Self-pay | Admitting: Internal Medicine

## 2016-06-21 ENCOUNTER — Telehealth: Payer: Self-pay | Admitting: Internal Medicine

## 2016-06-21 DIAGNOSIS — F900 Attention-deficit hyperactivity disorder, predominantly inattentive type: Secondary | ICD-10-CM

## 2016-06-21 MED ORDER — AMPHETAMINE-DEXTROAMPHETAMINE 30 MG PO TABS: 30.0000 mg | ORAL_TABLET | Freq: Every day | ORAL | Status: DC

## 2016-06-21 MED ORDER — AMPHETAMINE-DEXTROAMPHET ER 30 MG PO CP24: 30.0000 mg | ORAL_CAPSULE | Freq: Every day | ORAL | Status: AC

## 2016-06-21 NOTE — Telephone Encounter (Signed)
done

## 2016-06-21 NOTE — Telephone Encounter (Signed)
pls advise

## 2016-06-21 NOTE — Telephone Encounter (Signed)
Pt will be needing a refill in a few days for amphetamine-dextroamphetamine (ADDERALL XR) 30 MG 24 hr capsule [[id number]][[id number]] and amphetamine-dextroamphetamine (ADDERALL) 30 MG tablet [[id number]]   Please call pt regarding this

## 2016-06-21 NOTE — Telephone Encounter (Signed)
Lm stating rx is up front for pick up.

## 2016-08-13 ENCOUNTER — Other Ambulatory Visit: Payer: Self-pay | Admitting: Internal Medicine

## 2016-08-13 DIAGNOSIS — F900 Attention-deficit hyperactivity disorder, predominantly inattentive type: Secondary | ICD-10-CM

## 2016-08-13 MED ORDER — AMPHETAMINE-DEXTROAMPHETAMINE 30 MG PO TABS: 30.0000 mg | ORAL_TABLET | Freq: Every day | ORAL | 0 refills | Status: AC

## 2016-08-13 NOTE — Telephone Encounter (Signed)
LVM for pt to call back as soon as possible.   RE: rx is ready for pick up at the front.

## 2016-08-17 ENCOUNTER — Encounter: Payer: Self-pay | Admitting: Internal Medicine

## 2016-09-26 ENCOUNTER — Ambulatory Visit: Payer: Managed Care, Other (non HMO) | Admitting: Internal Medicine

## 2017-02-24 ENCOUNTER — Telehealth: Payer: Self-pay

## 2017-02-24 NOTE — Telephone Encounter (Signed)
He needs to be seen

## 2017-02-24 NOTE — Telephone Encounter (Signed)
Left vm for pt to call back, pt need to see Dr. Yetta BarreJones before he can get those med refill.

## 2017-02-24 NOTE — Telephone Encounter (Signed)
appt is set.  °

## 2017-02-24 NOTE — Telephone Encounter (Signed)
Pt call back and cancel the appt, he said he doesn't have money to come in for the appt because he does have insurance and he is moving out of stated. appt cancel

## 2017-02-24 NOTE — Telephone Encounter (Signed)
Pt came in and requested refills for the Adderall XR 30 mg, Adderall 30mg  and ambien 10 mg.   LOV: 05/2016. No ROV scheduled.

## 2017-03-03 ENCOUNTER — Ambulatory Visit: Payer: Managed Care, Other (non HMO) | Admitting: Internal Medicine

## 2017-06-04 ENCOUNTER — Telehealth (INDEPENDENT_AMBULATORY_CARE_PROVIDER_SITE_OTHER): Payer: Self-pay | Admitting: Rheumatology

## 2017-06-04 NOTE — Telephone Encounter (Signed)
[facility]

## 2018-02-09 ENCOUNTER — Telehealth (INDEPENDENT_AMBULATORY_CARE_PROVIDER_SITE_OTHER): Payer: Self-pay | Admitting: Rheumatology

## 2018-02-09 ENCOUNTER — Telehealth: Payer: Self-pay | Admitting: Internal Medicine

## 2018-02-09 NOTE — Telephone Encounter (Signed)
Patient called, lmvm requesting records be mailed to him. I called back and lm on his vm to [facility] to tell him he needs to sign medical records release fom before we can mail him copy of his records.

## 2018-02-09 NOTE — Telephone Encounter (Signed)
Called pt asking if he was needing the written results of the cxr and ct scan and pt stated he was needing a disc that had the scans on it.  Gave pt medical record's phone number for him to call to see if they could give him a disc of the scan.  Pt expressed understanding. Nothing further needed at this current time.

## 2018-02-10 ENCOUNTER — Telehealth: Payer: Self-pay | Admitting: Internal Medicine

## 2018-02-10 DIAGNOSIS — R918 Other nonspecific abnormal finding of lung field: Secondary | ICD-10-CM

## 2018-02-10 NOTE — Telephone Encounter (Signed)
Spoke with pt, he wanted MR to order a CT scan. According to his chart, he has not been seen by MR since 2014. I explained to him in detail that we could not order this test without him being seen because it was 5 years ago since the last OV. He states he lives out of town and would like to have this done the week starting 3/11. He said he would come in but you have no appts available. He wants to get his CT done before he goes back home so it would have to be done before sat 3/16. Please advise MR.      Patient Instructions by Kalman Shanamaswamy, Murali, MD at 12/01/2013 10:28 AM   Author: Kalman Shanamaswamy, Murali, MD Author Type: Physician Filed: 12/01/2013 10:28 AM  Note Status: Signed Cosign: Cosign Not Required Encounter Date: 12/01/2013  Editor: Kalman Shanamaswamy, Murali, MD (Physician)    #Left lower lobe nodule - 6mm  - highly unlikely this is lung cancer but we do not know what it is  - you need serial followup with CT for total 2 years minimum - return in 6 months with CT scan chest wo contrast  #followup  - after CT chest in 6 months    Instructions   #Left lower lobe nodule - 6mm  - highly unlikely this is lung cancer but we do not know what it is  - you need serial followup with CT for total 2 years minimum - return in 6 months with CT scan chest wo contrast  #followup  - after CT chest in 6 months

## 2018-02-11 NOTE — Telephone Encounter (Signed)
Attempted to call pt but no answer.   Left message for pt to return our call x1 

## 2018-02-11 NOTE — Telephone Encounter (Signed)
Please let h im know that this is one time exception. TEchnically per insurance rules once they are > 3 years since last visti they are new patients So go ahead and order CT chest without contrast for nodule followup. But please let him knwo that he will need to come in to be seen after that CT  Dr. Kalman ShanMurali Bryttney Netzer, M.D., [facility] Pulmonary and Critical Care Medicine Staff Physician, [facility] Center Director - Interstitial Lung Disease  Program  Pulmonary Fibrosis [facility] [place], [place], [id number]  Pager: [phone], If no answer or between  15:00h - 7:00h: call [phone] Telephone: [phone]

## 2018-02-12 NOTE — Telephone Encounter (Signed)
Pt is aware of below message and voiced his understanding. Pt states he will be leaving [place] next week.  Pt is requesting apt prior to leaving out of state.   MR please advise on apt and if okay to still order CT. Thanks

## 2018-02-12 NOTE — Telephone Encounter (Signed)
I have him scheduled for 3/12 at 11:45 for CT.  I have given pt the appt info.

## 2018-02-12 NOTE — Telephone Encounter (Signed)
ATC pt, no answer. Left message for pt to call back.  

## 2018-02-12 NOTE — Telephone Encounter (Signed)
Pt is calling back [phone]

## 2018-02-12 NOTE — Telephone Encounter (Signed)
Just give new appt with any provider because my schedule is full

## 2018-02-12 NOTE — Telephone Encounter (Addendum)
Spoke with pt, advised message from MR and pt agreed. PCC's can you let us know when this is scheduled because he needs a follow up appt after CT within the same week. Thanks.

## 2018-02-13 NOTE — Telephone Encounter (Signed)
Spoke with pt, advised him of appt and scheduled him to see SG on 3/14 to discuss CT results. Pt understood and nothing further is needed.

## 2018-02-13 NOTE — Telephone Encounter (Signed)
Pt is calling to sched a f/up apt for next week. Pt last seen 11/2013. At this time no open consult apt avail until 3/19 and pt will be out of state. CB is [phone].

## 2018-02-17 ENCOUNTER — Ambulatory Visit (INDEPENDENT_AMBULATORY_CARE_PROVIDER_SITE_OTHER)
Admission: RE | Admit: 2018-02-17 | Discharge: 2018-02-17 | Disposition: A | Discharge status: Home / Self Care | Payer: BLUE CROSS/BLUE SHIELD | Source: Ambulatory Visit | Attending: Internal Medicine | Admitting: Internal Medicine

## 2018-02-17 DIAGNOSIS — R918 Other nonspecific abnormal finding of lung field: Secondary | ICD-10-CM | POA: Diagnosis not present

## 2018-02-19 ENCOUNTER — Encounter: Payer: Self-pay | Admitting: Acute Care

## 2018-02-19 ENCOUNTER — Ambulatory Visit (INDEPENDENT_AMBULATORY_CARE_PROVIDER_SITE_OTHER): Payer: BLUE CROSS/BLUE SHIELD | Admitting: Acute Care

## 2018-02-19 DIAGNOSIS — I2510 Atherosclerotic heart disease of native coronary artery without angina pectoris: Secondary | ICD-10-CM | POA: Insufficient documentation

## 2018-02-19 DIAGNOSIS — R911 Solitary pulmonary nodule: Secondary | ICD-10-CM | POA: Diagnosis not present

## 2018-02-19 NOTE — Assessment & Plan Note (Signed)
Stable pulmonary nodules per CT Follow up with PCP for monitoring once you get to [place].

## 2018-02-19 NOTE — Progress Notes (Signed)
History of Present Illness Adam Bautista is a 62 y.o. 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 He was last seen in the office 11/2013 by Dr. Marchelle Bautista.   02/19/2018  Follow up of CT scan ordered by Dr. Marchelle Bautista Pt. Presents for the first time since 11/2013. He called the office requesting a CT chest be ordered. Dr. Marchelle Bautista has ordered the scan, and the patient was scheduled to come in today to discuss the reults of the scan per Dr. Marchelle Bautista. He is currently not on the plaquenil for his sarcoid. He has found that if he does not eat gluten, his sarcoid symptoms resolved. We discussed the findings of his CAD on the CT. We discussed that it is a non-gated exam, therefore degree or severity cannot be determined. We discussed the fact his father dropped dead of an MI at the age of 63 years old. 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 I explained with his family history he needs to be proactive regarding his heart. He is moving to [place] tomorrow.  I have asked him to follow up with cardiology  due to his  Family history and CT findings.He verbalized understanding.  Test Results: IMPRESSION: 1. All previously noted tiny pulmonary nodules are stable in size, number and distribution measuring 5 mm or less in size, compared to prior study from 2016. These findings are considered definitively benign at this time requiring no future imaging follow-up. 2. Aortic atherosclerosis, in addition to left anterior descending coronary artery disease. Please note that although the presence of coronary artery calcium documents the presence of coronary artery disease, the severity of this disease and any potential stenosis cannot be assessed on this non-gated CT examination. Assessment for potential risk factor modification, dietary therapy or pharmacologic therapy may be warranted, if clinically indicated.  Aortic Atherosclerosis (ICD10-I70.0).  CBC Latest  Ref Rng & Units 02/05/2016  WBC 4.0 - 10.5 K/uL 6.6  Hemoglobin 13.0 - 17.0 g/dL 16.1  Hematocrit 09.6 - 52.0 % 47.2  Platelets 150.0 - 400.0 K/uL 235.0    BMP Latest Ref Rng & Units 02/27/2016 02/05/2016  Glucose 70 - 99 mg/dL 045(W) 098(J)  BUN 6 - 23 mg/dL 14 16  Creatinine 1.91 - 1.50 mg/dL 4.78 2.95  Sodium 621 - 145 mEq/L 138 139  Potassium 3.5 - 5.1 mEq/L 4.0 4.5  Chloride 96 - 112 mEq/L 103 104  CO2 19 - 32 mEq/L 30 29  Calcium 8.4 - 10.5 mg/dL 9.4 9.1    BNP No results found for: BNP  ProBNP No results found for: PROBNP  PFT No results found for: FEV1PRE, FEV1POST, FVCPRE, FVCPOST, TLC, DLCOUNC, PREFEV1FVCRT, PSTFEV1FVCRT  Ct Chest Wo Contrast  Result Date: 02/17/2018 CLINICAL DATA:  62 year old male with history of pulmonary nodules. Follow-up study. EXAM: CT CHEST WITHOUT CONTRAST TECHNIQUE: Multidetector CT imaging of the chest was performed following the standard protocol without IV contrast. COMPARISON:  Chest CT 05/19/2015. FINDINGS: Cardiovascular: Heart size is normal. There is no significant pericardial fluid, thickening or pericardial calcification. There is aortic atherosclerosis, as well as atherosclerosis of the great vessels of the mediastinum and the coronary arteries, including calcified atherosclerotic plaque in the left anterior descending coronary artery. Mediastinum/Nodes: No pathologically enlarged mediastinal or hilar lymph nodes. Please note that accurate exclusion of hilar adenopathy is limited on noncontrast CT scans. Esophagus is unremarkable in appearance. No axillary lymphadenopathy. Lungs/Pleura: Multiple small pulmonary nodules scattered throughout both lungs are stable in size, number  and distribution compared to the prior study. The largest of these include a 4 x 6 mm nodule (mean diameter 5 mm) in the right lower lobe (axial image 120 of series 3) and a 4 x 6 mm left lower lobe nodule (mean diameter of 5 mm) in the left lower lobe (axial image 69 of  series 3). No other new larger more suspicious appearing pulmonary nodules or masses are noted. No acute consolidative airspace disease. No pleural effusions. Upper Abdomen: Visualized portions are unremarkable. Musculoskeletal: There are no aggressive appearing lytic or blastic lesions noted in the visualized portions of the skeleton. IMPRESSION: 1. All previously noted tiny pulmonary nodules are stable in size, number and distribution measuring 5 mm or less in size, compared to prior study from 2016. These findings are considered definitively benign at this time requiring no future imaging follow-up. 2. Aortic atherosclerosis, in addition to left anterior descending coronary artery disease. Please note that although the presence of coronary artery calcium documents the presence of coronary artery disease, the severity of this disease and any potential stenosis cannot be assessed on this non-gated CT examination. Assessment for potential risk factor modification, dietary therapy or pharmacologic therapy may be warranted, if clinically indicated. Aortic Atherosclerosis (ICD10-I70.0). Electronically Signed   By: Trudie Reed M.D.   On: 02/17/2018 15:22     Past medical hx Past Medical History:  Diagnosis Date  . GERD (gastroesophageal reflux disease)   . Sinus trouble      Social History   Tobacco Use  . Smoking status: Passive Smoke Exposure - Never Smoker  . Smokeless tobacco: Never Used  . Tobacco comment: Lived with grandfather for 8 years, smoked 2 ppd  Substance Use Topics  . Alcohol use: No    Comment: 1 glass of wine/day  . Drug use: No    [name] reports that he is a non-smoker but has been exposed to tobacco smoke. he has never used smokeless tobacco. He reports that he does not drink alcohol or use drugs.  Tobacco Cessation: Never smoker, but second hand smoke exposure.  Past surgical hx, Family hx, Social hx all reviewed.  Current Outpatient Medications on File Prior to  Visit  Medication Sig  . amphetamine-dextroamphetamine (ADDERALL XR) 30 MG 24 hr capsule Take 1 capsule (30 mg total) by mouth daily.  Marland Kitchen amphetamine-dextroamphetamine (ADDERALL) 30 MG tablet Take 1 tablet by mouth daily.  Marland Kitchen co-enzyme Q-10 30 MG capsule Take 30 mg by mouth daily.  Boris Lown Oil (MAXIMUM RED KRILL) 300 MG CAPS Take 1 capsule by mouth daily.  . Omega-3 Fatty Acids (FISH OIL) 1000 MG CAPS Take 1 capsule by mouth daily.  . Probiotic Product (PROBIOTIC DAILY PO) Take 1 capsule by mouth daily. Pt takes after eating dairy or when he remembers  . tadalafil (CIALIS) 5 MG tablet Take 1 tablet (5 mg total) by mouth daily.  . vitamin B-12 (CYANOCOBALAMIN) 100 MCG tablet Take 100 mcg by mouth daily.  Marland Kitchen zolpidem (AMBIEN) 10 MG tablet Take 1 tablet (10 mg total) by mouth at bedtime as needed for sleep.   No current facility-administered medications on file prior to visit.      Allergies  Allergen Reactions  . Antihistamines, Chlorpheniramine-Type   . Dramamine [Dimenhydrinate]   . Peanuts [Peanut Oil]     Causes mouth sores per pt  . Sulfa Antibiotics     hives    Review Of Systems:  Constitutional:   No  weight loss, night sweats,  Fevers, chills, fatigue, or  lassitude.  HEENT:   No headaches,  Difficulty swallowing,  Tooth/dental problems, or  Sore throat,                No sneezing, itching, ear ache, nasal congestion, post nasal drip,   CV:  No chest pain,  Orthopnea, PND, swelling in lower extremities, anasarca, dizziness, palpitations, syncope.   GI  No heartburn, indigestion, abdominal pain, nausea, vomiting, diarrhea, change in bowel habits, loss of appetite, bloody stools.   Resp: No shortness of breath with exertion or at rest.  No excess mucus, no productive cough,  No non-productive cough,  No coughing up of blood.  No change in color of mucus.  No wheezing.  No chest wall deformity  Skin: no rash or lesions.  GU: no dysuria, change in color of urine, no urgency or  frequency.  No flank pain, no hematuria   MS:  No joint pain or swelling.  No decreased range of motion.  No back pain.  Psych:  No change in mood or affect. No depression or anxiety.  No memory loss.   Vital Signs BP (!) 144/90 (BP Location: Left Arm, Cuff Size: Normal)   Pulse 78   Ht 6' (1.829 m)   Wt 215 lb (97.5 kg)   SpO2 97%   BMI 29.16 kg/m    Physical Exam:  General- No distress,  A&Ox3, pleasant ENT: No sinus tenderness, TM clear, pale nasal mucosa, no oral exudate,no post nasal drip, no LAN Cardiac: S1, S2, regular rate and rhythm, no murmur Chest: No wheeze/ rales/ dullness; no accessory muscle use, no nasal flaring, no sternal retractions Abd.: Soft Non-tender, non-distended Ext: No clubbing cyanosis, edema Neuro:  normal strength Skin: No rashes, warm and dry Psych: normal mood and behavior   Assessment/Plan  Lung nodule Stable pulmonary nodules per CT Follow up with PCP for monitoring once you get to [place].  CAD (coronary artery disease) Incidental finding on CT Chest. LAD involvement. Significant Family History ( Father dropped dead at 63 years old) Plan: Follow up with PCP and Cards in [place] ( Pt. Is moving 02/20/2018) Consider Cardiac CT with calcium scoring and echo/ stress test.    Bevelyn NgoSarah F Chukwuma Straus, NP 02/19/2018  10:17 AM

## 2018-02-19 NOTE — Assessment & Plan Note (Signed)
Incidental finding on CT Chest. LAD involvement. Significant Family History ( Father dropped dead at 63 years old) Plan: Follow up with PCP and Cards in [place] ( Pt. Is moving 02/20/2018) Consider Cardiac CT with calcium scoring and echo/ stress test.

## 2018-02-19 NOTE — Patient Instructions (Addendum)
It is good to see you today. Your CT scan shows stable pulmonary nodules. Please follow up with your PCP regarding the findings of coronary artery disease on your scan. We will provide you with a CD of your CT scan results. Daisey MustGood Luck in [place] . Please contact office for sooner follow up if symptoms do not improve or worsen or seek emergency care

## 2019-08-21 IMAGING — CT CT CHEST W/O CM
2 of 3 series · 15 of 36 positions shown, 18 images · non-contrast
Comparison: Chest CT 05/19/2015.

CLINICAL DATA: 61-year-old male with history of pulmonary nodules.
Follow-up study.

EXAM:
CT CHEST WITHOUT CONTRAST
TECHNIQUE: Multidetector CT imaging of the chest was performed following the
standard protocol without IV contrast.

[Series 2: thorax · axial · 0.84mm/px · z∈[-386,-84]mm · 12 of 179 slices shown, 15 images]
[im 14/179  mediastinal]
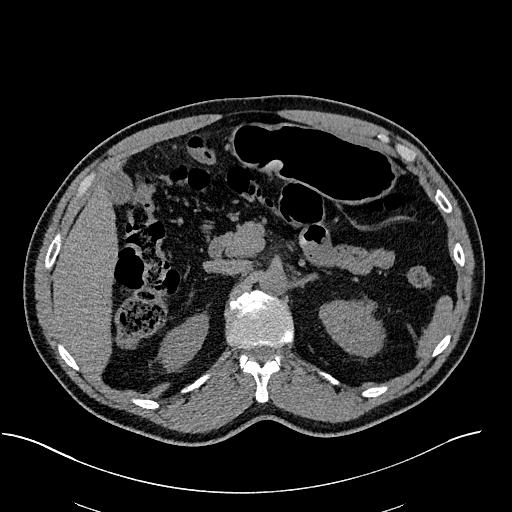
[im 14/179  lung]
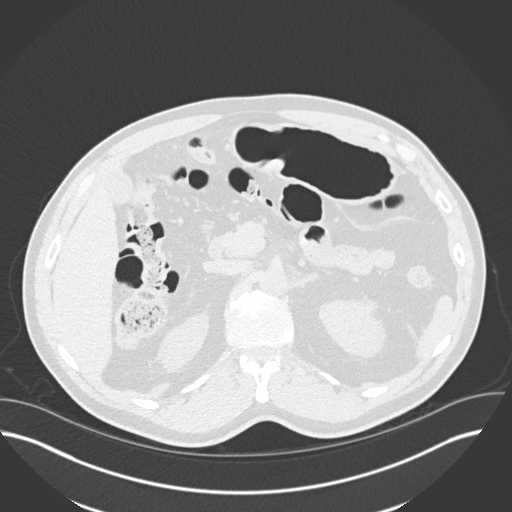
[im 27/179  lung]
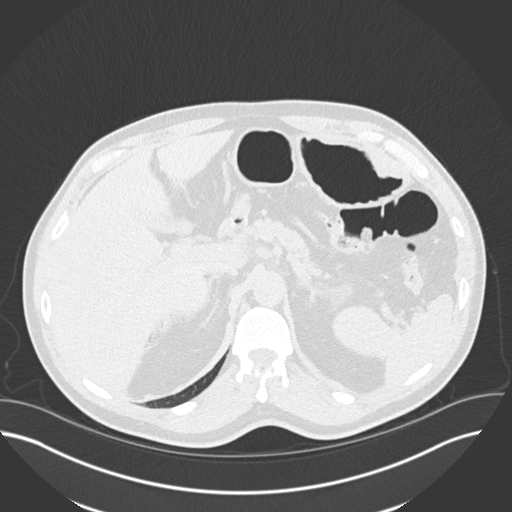
[im 40/179  lung]
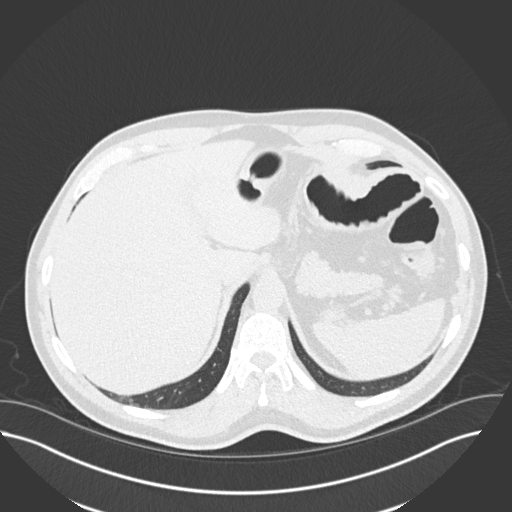
[im 53/179  lung]
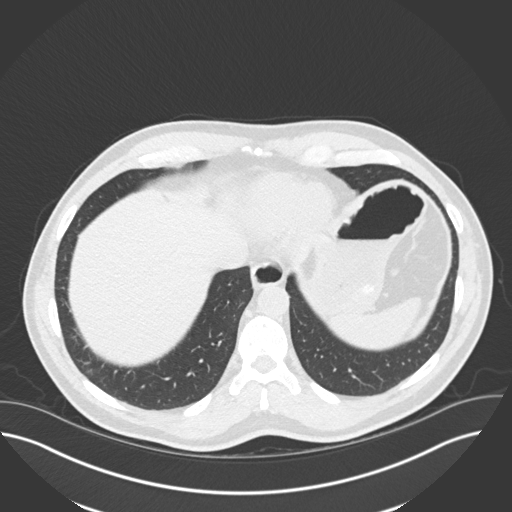
[im 66/179  mediastinal]
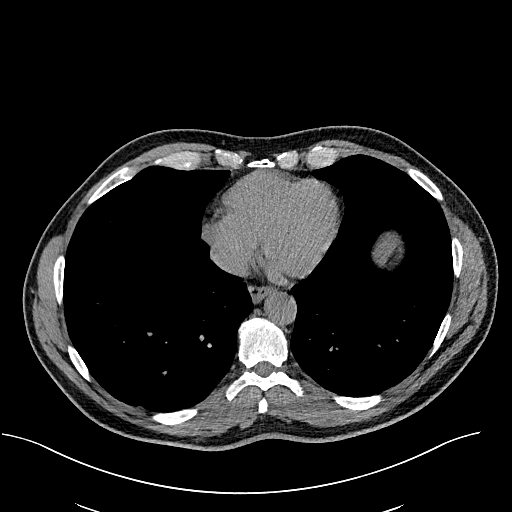
[im 66/179  lung]
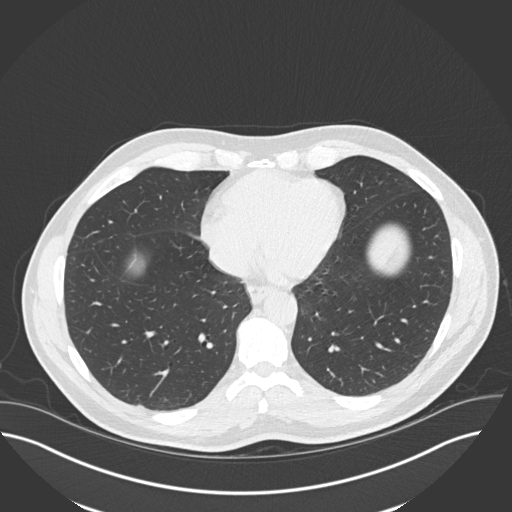
[im 80/179  lung]
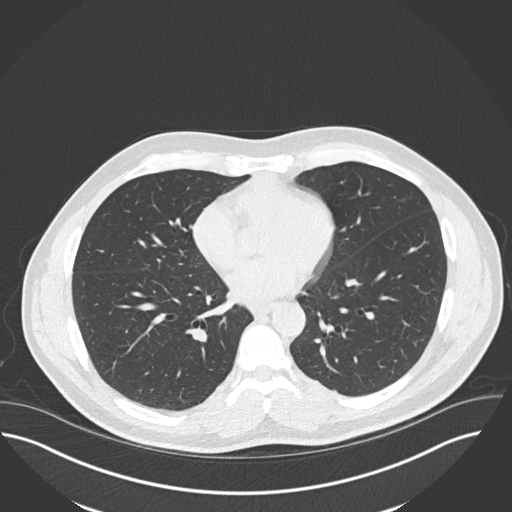
[im 99/179  lung]
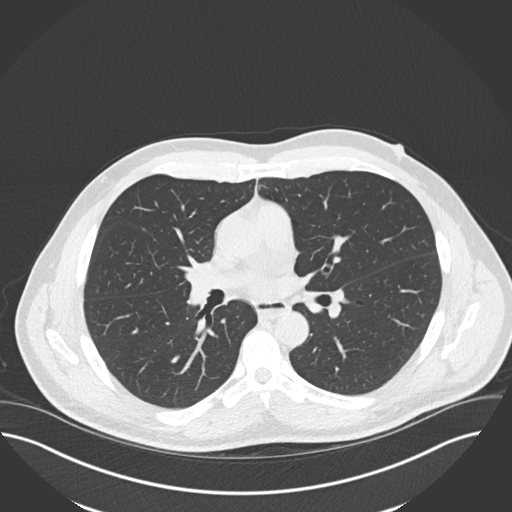
[im 113/179  lung]
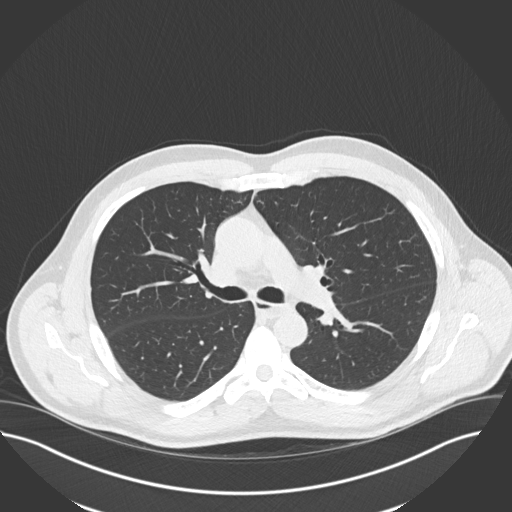
[im 126/179  mediastinal]
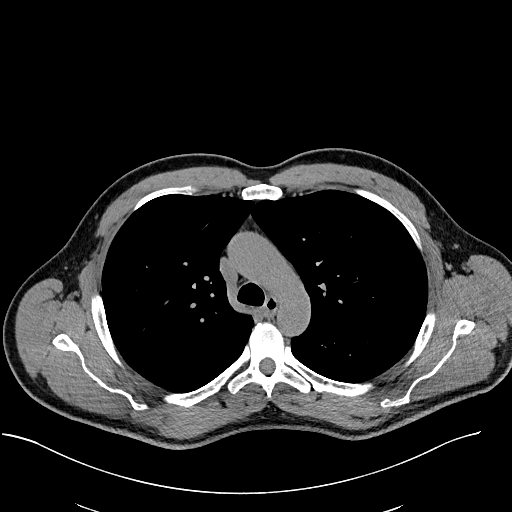
[im 126/179  lung]
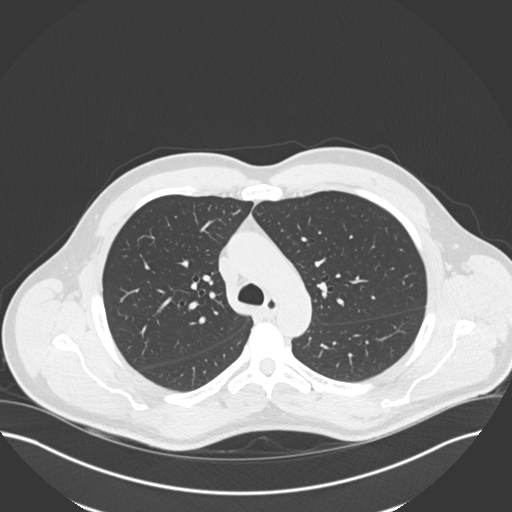
[im 139/179  lung]
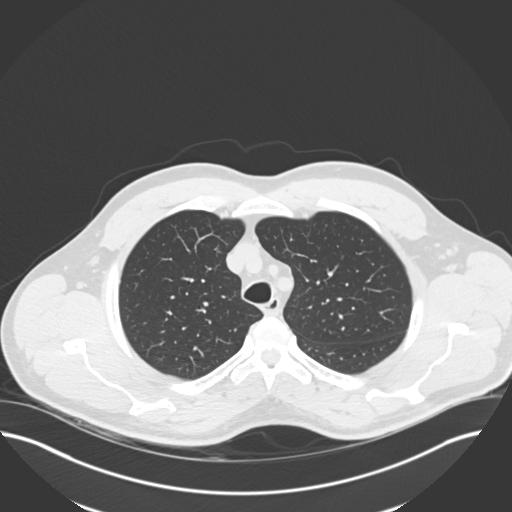
[im 152/179  lung]
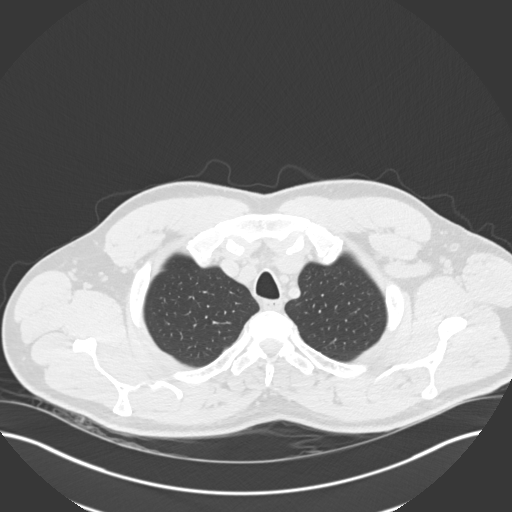
[im 165/179  lung]
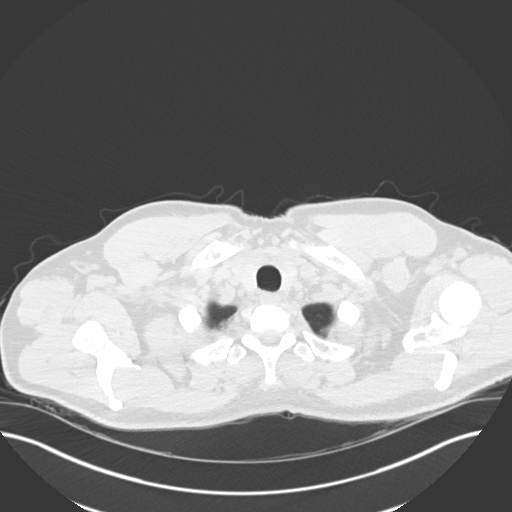

[Series 5: coronal · coronal · 0.70mm/px · 3 of 133 slices shown]
[im 27/133  lung]
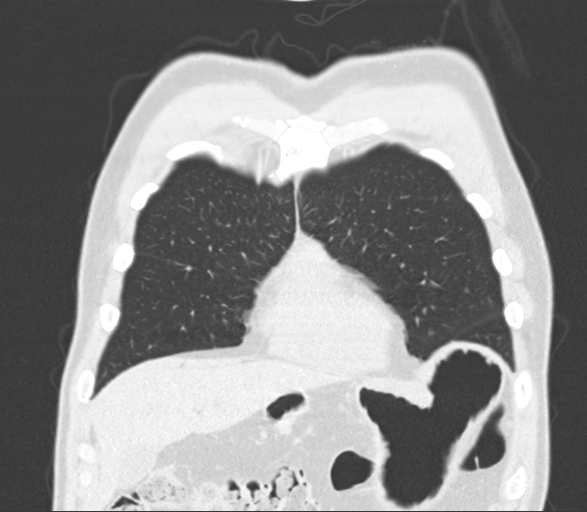
[im 53/133  lung]
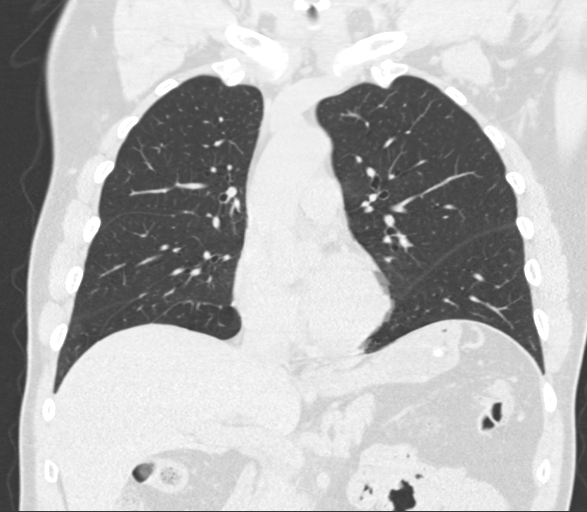
[im 80/133  lung]
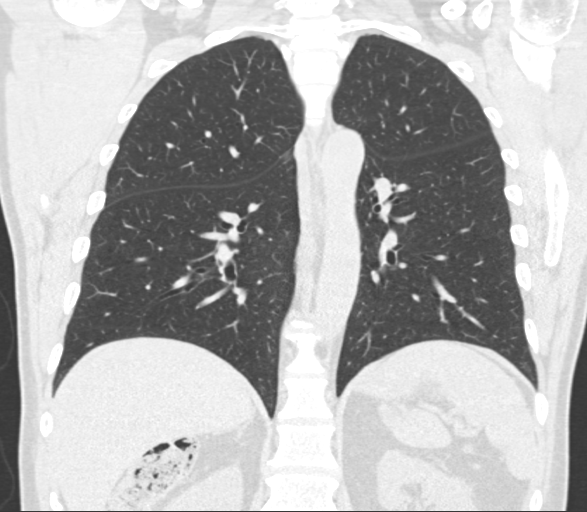

[15 of 36 positions shown; findings below may reference images not displayed]

FINDINGS: Cardiovascular: Heart size is normal. There is no significant
pericardial fluid, thickening or pericardial calcification. There is
aortic atherosclerosis, as well as atherosclerosis of the great
vessels of the mediastinum and the coronary arteries, including
calcified atherosclerotic plaque in the left anterior descending
coronary artery.

Mediastinum/Nodes: No pathologically enlarged mediastinal or hilar
lymph nodes. Please note that accurate exclusion of hilar adenopathy
is limited on noncontrast CT scans. Esophagus is unremarkable in
appearance. No axillary lymphadenopathy.

Lungs/Pleura: Multiple small pulmonary nodules scattered throughout
both lungs are stable in size, number and distribution compared to
the prior study. The largest of these include a 4 x 6 mm nodule
(mean diameter 5 mm) in the right lower lobe (axial image 120 of
series 3) and a 4 x 6 mm left lower lobe nodule (mean diameter of 5
mm) in the left lower lobe (axial image 69 of series 3). No other
new larger more suspicious appearing pulmonary nodules or masses are
noted. No acute consolidative airspace disease. No pleural
effusions.

Upper Abdomen: Visualized portions are unremarkable.

Musculoskeletal: There are no aggressive appearing lytic or blastic
lesions noted in the visualized portions of the skeleton.
IMPRESSION: 1. All previously noted tiny pulmonary nodules are stable in size,
number and distribution measuring 5 mm or less in size, compared to
prior study from 7668. These findings are considered definitively
benign at this time requiring no future imaging follow-up.
2. Aortic atherosclerosis, in addition to left anterior descending
coronary artery disease. Please note that although the presence of
coronary artery calcium documents the presence of coronary artery
disease, the severity of this disease and any potential stenosis
cannot be assessed on this non-gated CT examination. Assessment for
potential risk factor modification, dietary therapy or pharmacologic
therapy may be warranted, if clinically indicated.

Aortic Atherosclerosis ([id number]-[id number]).
